# Patient Record
Sex: Female | Born: 1990
Health system: Southern US, Community
[De-identification: ages and names within clinical notes are randomized; demographics above are authoritative.]

## PROBLEM LIST (undated history)

## (undated) ENCOUNTER — Inpatient Hospital Stay (HOSPITAL_COMMUNITY): Payer: Self-pay

## (undated) DIAGNOSIS — E049 Nontoxic goiter, unspecified: Secondary | ICD-10-CM

## (undated) DIAGNOSIS — Z8619 Personal history of other infectious and parasitic diseases: Secondary | ICD-10-CM

## (undated) DIAGNOSIS — I8390 Asymptomatic varicose veins of unspecified lower extremity: Secondary | ICD-10-CM

## (undated) DIAGNOSIS — R51 Headache: Secondary | ICD-10-CM

## (undated) DIAGNOSIS — R519 Headache, unspecified: Secondary | ICD-10-CM

## (undated) DIAGNOSIS — R87629 Unspecified abnormal cytological findings in specimens from vagina: Secondary | ICD-10-CM

## (undated) HISTORY — DX: Unspecified abnormal cytological findings in specimens from vagina: R87.629

## (undated) HISTORY — PX: NO PAST SURGERIES: SHX2092

## (undated) SURGERY — Surgical Case
Anesthesia: *Unknown

---

## 2013-12-29 LAB — OB RESULTS CONSOLE ANTIBODY SCREEN: Antibody Screen: NEGATIVE

## 2013-12-29 LAB — OB RESULTS CONSOLE RPR: RPR: NONREACTIVE

## 2013-12-29 LAB — OB RESULTS CONSOLE ABO/RH: RH Type: POSITIVE

## 2013-12-29 LAB — OB RESULTS CONSOLE HEPATITIS B SURFACE ANTIGEN: Hepatitis B Surface Ag: NEGATIVE

## 2013-12-29 LAB — OB RESULTS CONSOLE RUBELLA ANTIBODY, IGM: Rubella: IMMUNE

## 2013-12-29 LAB — OB RESULTS CONSOLE HIV ANTIBODY (ROUTINE TESTING): HIV: NONREACTIVE

## 2014-01-03 LAB — OB RESULTS CONSOLE GC/CHLAMYDIA
Chlamydia: NEGATIVE
GC PROBE AMP, GENITAL: NEGATIVE

## 2014-05-23 ENCOUNTER — Inpatient Hospital Stay (HOSPITAL_COMMUNITY): Admission: AD | Admit: 2014-05-23 | Payer: Self-pay | Source: Ambulatory Visit | Admitting: Obstetrics and Gynecology

## 2014-07-06 ENCOUNTER — Telehealth (HOSPITAL_COMMUNITY): Payer: Self-pay | Admitting: *Deleted

## 2014-07-06 ENCOUNTER — Encounter (HOSPITAL_COMMUNITY): Payer: Self-pay | Admitting: *Deleted

## 2014-07-06 LAB — OB RESULTS CONSOLE GBS: STREP GROUP B AG: POSITIVE

## 2014-07-06 NOTE — Telephone Encounter (Signed)
Preadmission screen  

## 2014-07-15 ENCOUNTER — Inpatient Hospital Stay (HOSPITAL_COMMUNITY)
Admission: RE | Admit: 2014-07-15 | Discharge: 2014-07-19 | DRG: 766 | Disposition: A | Discharge status: Home / Self Care | Payer: 59 | Source: Ambulatory Visit | Attending: Obstetrics and Gynecology | Admitting: Obstetrics and Gynecology

## 2014-07-15 ENCOUNTER — Inpatient Hospital Stay (HOSPITAL_COMMUNITY): Payer: 59 | Admitting: Anesthesiology

## 2014-07-15 ENCOUNTER — Other Ambulatory Visit: Payer: Self-pay | Admitting: Obstetrics and Gynecology

## 2014-07-15 ENCOUNTER — Encounter (HOSPITAL_COMMUNITY): Payer: Self-pay

## 2014-07-15 VITALS — BP 122/69 | HR 74 | Temp 98.0°F | Resp 18 | Ht 62.0 in | Wt 161.0 lb

## 2014-07-15 DIAGNOSIS — Z3490 Encounter for supervision of normal pregnancy, unspecified, unspecified trimester: Secondary | ICD-10-CM

## 2014-07-15 DIAGNOSIS — N9089 Other specified noninflammatory disorders of vulva and perineum: Secondary | ICD-10-CM | POA: Diagnosis present

## 2014-07-15 DIAGNOSIS — R1110 Vomiting, unspecified: Secondary | ICD-10-CM | POA: Diagnosis present

## 2014-07-15 DIAGNOSIS — O99824 Streptococcus B carrier state complicating childbirth: Secondary | ICD-10-CM | POA: Diagnosis present

## 2014-07-15 DIAGNOSIS — N858 Other specified noninflammatory disorders of uterus: Secondary | ICD-10-CM | POA: Diagnosis present

## 2014-07-15 DIAGNOSIS — Z3493 Encounter for supervision of normal pregnancy, unspecified, third trimester: Secondary | ICD-10-CM

## 2014-07-15 DIAGNOSIS — O9989 Other specified diseases and conditions complicating pregnancy, childbirth and the puerperium: Secondary | ICD-10-CM | POA: Diagnosis present

## 2014-07-15 DIAGNOSIS — Z3A39 39 weeks gestation of pregnancy: Secondary | ICD-10-CM | POA: Diagnosis present

## 2014-07-15 DIAGNOSIS — O1203 Gestational edema, third trimester: Secondary | ICD-10-CM | POA: Diagnosis present

## 2014-07-15 LAB — RPR: RPR Ser Ql: NONREACTIVE

## 2014-07-15 LAB — CBC
HEMATOCRIT: 35.3 % — AB (ref 36.0–46.0)
Hemoglobin: 12.3 g/dL (ref 12.0–15.0)
MCH: 32.6 pg (ref 26.0–34.0)
MCHC: 34.8 g/dL (ref 30.0–36.0)
MCV: 93.6 fL (ref 78.0–100.0)
PLATELETS: 238 10*3/uL (ref 150–400)
RBC: 3.77 MIL/uL — ABNORMAL LOW (ref 3.87–5.11)
RDW: 14.3 % (ref 11.5–15.5)
WBC: 12.2 10*3/uL — AB (ref 4.0–10.5)

## 2014-07-15 LAB — ABO/RH: ABO/RH(D): B POS

## 2014-07-15 LAB — TYPE AND SCREEN
ABO/RH(D): B POS
Antibody Screen: NEGATIVE

## 2014-07-15 MED ORDER — LACTATED RINGERS IV SOLN: 500.0000 mL | INTRAVENOUS | Status: DC | PRN

## 2014-07-15 MED ORDER — CITRIC ACID-SODIUM CITRATE 334-500 MG/5ML PO SOLN
30.0000 mL | ORAL | Status: DC | PRN
  Administered 2014-07-16: 30 mL via ORAL
  Filled 2014-07-15: qty 15

## 2014-07-15 MED ORDER — OXYCODONE-ACETAMINOPHEN 5-325 MG PO TABS: 1.0000 | ORAL_TABLET | ORAL | Status: DC | PRN

## 2014-07-15 MED ORDER — FENTANYL 2.5 MCG/ML BUPIVACAINE 1/10 % EPIDURAL INFUSION (WH - ANES)
INTRAMUSCULAR | Status: DC | PRN
  Administered 2014-07-15: 14 mL/h via EPIDURAL

## 2014-07-15 MED ORDER — PHENYLEPHRINE 40 MCG/ML (10ML) SYRINGE FOR IV PUSH (FOR BLOOD PRESSURE SUPPORT): 80.0000 ug | PREFILLED_SYRINGE | INTRAVENOUS | Status: DC | PRN

## 2014-07-15 MED ORDER — LIDOCAINE HCL (PF) 1 % IJ SOLN
INTRAMUSCULAR | Status: DC | PRN
  Administered 2014-07-15 (×2): 4 mL

## 2014-07-15 MED ORDER — HYDROXYZINE HCL 50 MG PO TABS: 50.0000 mg | ORAL_TABLET | Freq: Four times a day (QID) | ORAL | Status: DC | PRN

## 2014-07-15 MED ORDER — BUTORPHANOL TARTRATE 1 MG/ML IJ SOLN
1.0000 mg | INTRAMUSCULAR | Status: DC | PRN
  Administered 2014-07-15 (×3): 1 mg via INTRAVENOUS
  Filled 2014-07-15 (×3): qty 1

## 2014-07-15 MED ORDER — LIDOCAINE HCL (PF) 1 % IJ SOLN: 30.0000 mL | INTRAMUSCULAR | Status: DC | PRN

## 2014-07-15 MED ORDER — TERBUTALINE SULFATE 1 MG/ML IJ SOLN: 0.2500 mg | Freq: Once | INTRAMUSCULAR | Status: AC | PRN

## 2014-07-15 MED ORDER — FENTANYL 2.5 MCG/ML BUPIVACAINE 1/10 % EPIDURAL INFUSION (WH - ANES)
14.0000 mL/h | INTRAMUSCULAR | Status: DC | PRN
  Administered 2014-07-16: 14 mL/h via EPIDURAL
  Filled 2014-07-15 (×2): qty 125

## 2014-07-15 MED ORDER — EPHEDRINE 5 MG/ML INJ: 10.0000 mg | INTRAVENOUS | Status: DC | PRN

## 2014-07-15 MED ORDER — LACTATED RINGERS IV SOLN
INTRAVENOUS | Status: DC
  Administered 2014-07-15 – 2014-07-16 (×5): via INTRAVENOUS

## 2014-07-15 MED ORDER — LACTATED RINGERS IV SOLN: 500.0000 mL | Freq: Once | INTRAVENOUS | Status: DC

## 2014-07-15 MED ORDER — DIPHENHYDRAMINE HCL 50 MG/ML IJ SOLN: 12.5000 mg | INTRAMUSCULAR | Status: DC | PRN

## 2014-07-15 MED ORDER — OXYTOCIN BOLUS FROM INFUSION: 500.0000 mL | INTRAVENOUS | Status: DC

## 2014-07-15 MED ORDER — PENICILLIN G POTASSIUM 5000000 UNITS IJ SOLR
5.0000 10*6.[IU] | Freq: Once | INTRAVENOUS | Status: AC
  Administered 2014-07-15: 5 10*6.[IU] via INTRAVENOUS
  Filled 2014-07-15: qty 5

## 2014-07-15 MED ORDER — OXYTOCIN 40 UNITS IN LACTATED RINGERS INFUSION - SIMPLE MED
1.0000 m[IU]/min | INTRAVENOUS | Status: DC
  Administered 2014-07-15: 1 m[IU]/min via INTRAVENOUS
  Filled 2014-07-15: qty 1000

## 2014-07-15 MED ORDER — DEXTROSE 5 % IV SOLN
2.5000 10*6.[IU] | INTRAVENOUS | Status: DC
  Administered 2014-07-15 – 2014-07-16 (×5): 2.5 10*6.[IU] via INTRAVENOUS
  Filled 2014-07-15 (×8): qty 2.5

## 2014-07-15 MED ORDER — OXYCODONE-ACETAMINOPHEN 5-325 MG PO TABS: 2.0000 | ORAL_TABLET | ORAL | Status: DC | PRN

## 2014-07-15 MED ORDER — ONDANSETRON HCL 4 MG/2ML IJ SOLN: 4.0000 mg | Freq: Four times a day (QID) | INTRAMUSCULAR | Status: DC | PRN

## 2014-07-15 MED ORDER — PHENYLEPHRINE 40 MCG/ML (10ML) SYRINGE FOR IV PUSH (FOR BLOOD PRESSURE SUPPORT)
80.0000 ug | PREFILLED_SYRINGE | INTRAVENOUS | Status: DC | PRN
  Filled 2014-07-15: qty 20

## 2014-07-15 MED ORDER — ACETAMINOPHEN 325 MG PO TABS
650.0000 mg | ORAL_TABLET | ORAL | Status: DC | PRN
  Administered 2014-07-16: 650 mg via ORAL
  Filled 2014-07-15: qty 2

## 2014-07-15 MED ORDER — OXYTOCIN 40 UNITS IN LACTATED RINGERS INFUSION - SIMPLE MED: 62.5000 mL/h | INTRAVENOUS | Status: DC

## 2014-07-15 NOTE — H&P (Signed)
Rachel Lynch is a 24 y.o. female presenting for at 4539 6/7 weeks dated by first trimester ultrasound presents for elective IOL. Pt desires delivery by EDD.   Pt transferred into [facility] at ~ 16 weeks.  Pregnancy with complications as below.  No significant issues after 20 weeks.  GBS positive. Pt reports she had strong regular contractions overnight but they have now spaced out with Pitocin.    Maternal Medical History:  Reason for admission: Induction of labor.  Contractions: Onset was 6-12 hours ago.   Frequency: regular.   Perceived severity is moderate.    Fetal activity: Perceived fetal activity is normal.    Prenatal complications: Hyperemesis Mild hyperthyroidism-resolved. Bleeding at 16 weeks, etiology unknown  Prenatal Complications - Diabetes: none.    OB History    Gravida Para Term Preterm AB TAB SAB Ectopic Multiple Living   5 2 2  2  1   2      Past Medical History  Diagnosis Date  . Vaginal Pap smear, abnormal    Past Surgical History  Procedure Laterality Date  . No past surgeries     Family History: family history includes Clotting disorder in her paternal grandmother; Diabetes in her maternal grandmother and maternal uncle; Hypertension in her paternal grandmother. Social History:  reports that she has never smoked. She does not have any smokeless tobacco history on file. She reports that she does not drink alcohol or use illicit drugs.   Prenatal Transfer Tool  Maternal Diabetes: No Genetic Screening: Normal Maternal Ultrasounds/Referrals: Normal Fetal Ultrasounds or other Referrals:  None Maternal Substance Abuse:  No Significant Maternal Medications:  None Significant Maternal Lab Results:  Lab values include: Group B Strep positive Other Comments:  None  Review of Systems  Gastrointestinal: Positive for abdominal pain.  Genitourinary:       No vaginal bleeding or LOF.    Dilation: 1.5 Effacement (%): 60 Station: -2 Exam by:: dr  Dion Bodyvarnado Blood pressure 109/81, pulse 107, temperature 98.2 F (36.8 C), temperature source Oral, resp. rate 18, height 5\' 2"  (1.575 m), weight 73.029 kg (161 lb), last menstrual period 09/15/2013. Maternal Exam:  Uterine Assessment: Contraction frequency is rare.   Abdomen: Patient reports no abdominal tenderness. Estimated fetal weight is 7 lbs.   Fetal presentation: vertex  Introitus: Vulva is positive for vulval edema. Normal vagina.  Ferning test: not done.   Pelvis: adequate for delivery.   Cervix: Cervix evaluated by digital exam.     Fetal Exam Fetal Monitor Review: Mode: fetoscope.   Pattern: accelerations present and no decelerations.    Fetal State Assessment: Category I - tracings are normal.    Membranes stripped.  Physical Exam  Constitutional: She appears well-developed and well-nourished.  HENT:  Head: Normocephalic and atraumatic.  Eyes: EOM are normal.  Neck: Normal range of motion.  Cardiovascular: Normal rate and regular rhythm.   Respiratory: Breath sounds normal. She is in respiratory distress.  GI:  Gravid, nontender.  Musculoskeletal: She exhibits edema. She exhibits no tenderness.  Neurological: She is alert.  Skin: Skin is warm and dry.  Psychiatric: She has a normal mood and affect.    Prenatal labs: ABO, Rh: --/--/B POS (04/09 0800) Antibody: NEG (04/09 0800) Rubella: Immune (09/24 0000) RPR: Nonreactive (09/24 0000)  HBsAg: Negative (09/24 0000)  HIV: Non-reactive (09/24 0000)  GBS: Positive (03/31 0000)   Assessment/Plan: IUP @ 39 6/7 weeks IOL with Pitocin increasing 2 by 2. Pt does not want an epidural.  IV Stadol ordered. GBS+. PCN for GBS prophylaxis. 2nd dose of PCN due ~ 1 pm. AROM with signs of labor and after 2nd dose administered.    Geryl Rankins 07/15/2014, 11:36 AM

## 2014-07-15 NOTE — Progress Notes (Signed)
Micael HampshireShanel S Solt is a 24 y.o. [id number] at 5770w6d   Subjective: Pt requesting pain medications.  S/p Stadol x 2.  Objective: BP 128/65 mmHg  Pulse 88  Temp(Src) 98.1 F (36.7 C) (Oral)  Resp 20  Ht 5\' 2"  (1.575 m)  Wt 73.029 kg (161 lb)  BMI 29.44 kg/m2  LMP 09/15/2013      FHT:  FHR: 130s bpm, variability: moderate,  accelerations:  Present,  decelerations:  Absent UC:   regular, every 3-4 minutes SVE:   Dilation: 2.5 Effacement (%): 50 Station: -2 Exam by:: Dr Dion BodyVarnado AROM 3 cm/70/-3. Cervix more posterior than previous Pitocin 20 mUs. IUPC placed due to protracted labor. Questionable light meconium.  Labs: Lab Results  Component Value Date   WBC 12.2* 07/15/2014   HGB 12.3 07/15/2014   HCT 35.3* 07/15/2014   MCV 93.6 07/15/2014   PLT 238 07/15/2014    Assessment / Plan: IUP @ 39 6/7 weeks. Elective IOL  Labor: Augmentation of labor with AROM. Preeclampsia:  BP normal. Fetal Wellbeing:  Category I Pain Control:  Stadol.  Epidural per pt request. I/D:  GBS positive.  S/p PCN x 3 doses. Anticipated MOD:  NSVD  Anjelica Gorniak 07/15/2014, 7:56 PM

## 2014-07-15 NOTE — Anesthesia Preprocedure Evaluation (Signed)
Anesthesia Evaluation  Patient identified by MRN, date of birth, ID band Patient awake    Reviewed: Allergy & Precautions, Patient's Chart, lab work & pertinent test results  Airway Mallampati: II  TM Distance: >3 FB Neck ROM: Full    Dental no notable dental hx. (+) Teeth Intact   Pulmonary neg pulmonary ROS,  breath sounds clear to auscultation  Pulmonary exam normal       Cardiovascular negative cardio ROS  Rhythm:Regular Rate:Normal     Neuro/Psych negative neurological ROS  negative psych ROS   GI/Hepatic negative GI ROS, Neg liver ROS,   Endo/Other  negative endocrine ROS  Renal/GU negative Renal ROS  negative genitourinary   Musculoskeletal negative musculoskeletal ROS (+)   Abdominal   Peds  Hematology negative hematology ROS (+)   Anesthesia Other Findings   Reproductive/Obstetrics (+) Pregnancy                             Anesthesia Physical Anesthesia Plan  ASA: II  Anesthesia Plan: Epidural   Post-op Pain Management:    Induction:   Airway Management Planned: Natural Airway  Additional Equipment:   Intra-op Plan:   Post-operative Plan:   Informed Consent: I have reviewed the patients History and Physical, chart, labs and discussed the procedure including the risks, benefits and alternatives for the proposed anesthesia with the patient or authorized representative who has indicated his/her understanding and acceptance.     Plan Discussed with: Anesthesiologist  Anesthesia Plan Comments:         Anesthesia Quick Evaluation

## 2014-07-15 NOTE — Anesthesia Procedure Notes (Signed)
Epidural Patient location during procedure: OB Start time: 07/15/2014 9:45 PM  Staffing Anesthesiologist: Mal AmabileFOSTER, Ravan Schlemmer Performed by: anesthesiologist   Preanesthetic Checklist Completed: patient identified, site marked, surgical consent, pre-op evaluation, timeout performed, IV checked, risks and benefits discussed and monitors and equipment checked  Epidural Patient position: sitting Prep: site prepped and draped and DuraPrep Patient monitoring: continuous pulse ox and blood pressure Approach: midline Location: L3-L4 Injection technique: LOR air  Needle:  Needle type: Tuohy  Needle gauge: 17 G Needle length: 9 cm and 9 Needle insertion depth: 4 cm Catheter type: closed end flexible Catheter size: 19 Gauge Catheter at skin depth: 9 cm Test dose: negative and Other  Assessment Events: blood not aspirated, injection not painful, no injection resistance, negative IV test and no paresthesia  Additional Notes Patient identified. Risks and benefits discussed including failed block, incomplete  Pain control, post dural puncture headache, nerve damage, paralysis, blood pressure Changes, nausea, vomiting, reactions to medications-both toxic and allergic and post Partum back pain. All questions were answered. Patient expressed understanding and wished to proceed. Sterile technique was used throughout procedure. Epidural site was Dressed with sterile barrier dressing. No paresthesias, signs of intravascular injection Or signs of intrathecal spread were encountered.  Patient was more comfortable after the epidural was dosed. Please see RN's note for documentation of vital signs and FHR which are stable.

## 2014-07-15 NOTE — Progress Notes (Signed)
In to assess pt.  Epidural just placed.  Return in 30 minutes to check cervix.  Pt still breathing through contractions.

## 2014-07-15 NOTE — Progress Notes (Signed)
In to assess pt.  RN checked pt ~ 4 pm. Was 2 cm. Pt breathing through contractions. Deferred repeat cervical exam. Cat 1 tracing. Contractions palpate firm. Pitocin-18 mUs. Pt encouraged to labor out of bed. Reassess at 6 pm.  Pt would like to defer AROM at this time.

## 2014-07-15 NOTE — Plan of Care (Signed)
Problem: Consults Goal: Birthing Suites Patient Information Press F2 to bring up selections list  Outcome: Completed/Met Date Met:  07/15/14  Pt 37-[redacted] weeks EGA and Inpatient induction     

## 2014-07-16 ENCOUNTER — Encounter (HOSPITAL_COMMUNITY)
Admission: RE | Disposition: A | Discharge status: Home / Self Care | Payer: Self-pay | Source: Ambulatory Visit | Attending: Obstetrics and Gynecology

## 2014-07-16 ENCOUNTER — Encounter (HOSPITAL_COMMUNITY): Payer: Self-pay

## 2014-07-16 SURGERY — Surgical Case
Anesthesia: Epidural

## 2014-07-16 MED ORDER — DIPHENHYDRAMINE HCL 50 MG/ML IJ SOLN
12.5000 mg | INTRAMUSCULAR | Status: DC | PRN
  Administered 2014-07-16 – 2014-07-17 (×3): 12.5 mg via INTRAVENOUS
  Filled 2014-07-16 (×3): qty 1

## 2014-07-16 MED ORDER — OXYTOCIN 40 UNITS IN LACTATED RINGERS INFUSION - SIMPLE MED: 1.0000 m[IU]/min | INTRAVENOUS | Status: DC

## 2014-07-16 MED ORDER — OXYCODONE-ACETAMINOPHEN 5-325 MG PO TABS
2.0000 | ORAL_TABLET | ORAL | Status: DC | PRN
  Administered 2014-07-17 – 2014-07-18 (×2): 2 via ORAL
  Filled 2014-07-16 (×2): qty 2

## 2014-07-16 MED ORDER — ONDANSETRON HCL 4 MG/2ML IJ SOLN: 4.0000 mg | Freq: Three times a day (TID) | INTRAMUSCULAR | Status: DC | PRN

## 2014-07-16 MED ORDER — DIPHENHYDRAMINE HCL 25 MG PO CAPS
25.0000 mg | ORAL_CAPSULE | Freq: Four times a day (QID) | ORAL | Status: DC | PRN
  Administered 2014-07-17: 25 mg via ORAL
  Filled 2014-07-16: qty 1

## 2014-07-16 MED ORDER — SODIUM BICARBONATE 8.4 % IV SOLN
INTRAVENOUS | Status: DC | PRN
  Administered 2014-07-16: 6 mL via EPIDURAL
  Administered 2014-07-16: 4 mL via EPIDURAL
  Administered 2014-07-16: 5 mL via EPIDURAL

## 2014-07-16 MED ORDER — METHYLERGONOVINE MALEATE 0.2 MG PO TABS: 0.2000 mg | ORAL_TABLET | ORAL | Status: DC | PRN

## 2014-07-16 MED ORDER — SODIUM CHLORIDE 0.9 % IJ SOLN: 3.0000 mL | INTRAMUSCULAR | Status: DC | PRN

## 2014-07-16 MED ORDER — NALOXONE HCL 1 MG/ML IJ SOLN
1.0000 ug/kg/h | INTRAVENOUS | Status: DC | PRN
  Filled 2014-07-16: qty 2

## 2014-07-16 MED ORDER — NALBUPHINE HCL 10 MG/ML IJ SOLN: 5.0000 mg | INTRAMUSCULAR | Status: DC | PRN

## 2014-07-16 MED ORDER — OXYCODONE-ACETAMINOPHEN 5-325 MG PO TABS
1.0000 | ORAL_TABLET | ORAL | Status: DC | PRN
  Administered 2014-07-16 – 2014-07-19 (×9): 1 via ORAL
  Filled 2014-07-16 (×10): qty 1

## 2014-07-16 MED ORDER — ACETAMINOPHEN 500 MG PO TABS
1000.0000 mg | ORAL_TABLET | Freq: Four times a day (QID) | ORAL | Status: AC
  Administered 2014-07-16: 1000 mg via ORAL
  Filled 2014-07-16 (×2): qty 2

## 2014-07-16 MED ORDER — FENTANYL CITRATE 0.05 MG/ML IJ SOLN
INTRAMUSCULAR | Status: AC
  Administered 2014-07-16: 50 ug via INTRAVENOUS
  Filled 2014-07-16: qty 2

## 2014-07-16 MED ORDER — DIBUCAINE 1 % RE OINT: 1.0000 "application " | TOPICAL_OINTMENT | RECTAL | Status: DC | PRN

## 2014-07-16 MED ORDER — AMPICILLIN-SULBACTAM SODIUM 3 (2-1) G IJ SOLR
3.0000 g | INTRAMUSCULAR | Status: AC
  Administered 2014-07-16: 3 g via INTRAVENOUS
  Filled 2014-07-16: qty 3

## 2014-07-16 MED ORDER — NALBUPHINE HCL 10 MG/ML IJ SOLN
5.0000 mg | Freq: Once | INTRAMUSCULAR | Status: AC | PRN
  Administered 2014-07-16: 5 mg via INTRAVENOUS
  Filled 2014-07-16: qty 1

## 2014-07-16 MED ORDER — 0.9 % SODIUM CHLORIDE (POUR BTL) OPTIME
TOPICAL | Status: DC | PRN
  Administered 2014-07-16: 250 mL
  Administered 2014-07-16: 400 mL

## 2014-07-16 MED ORDER — KETOROLAC TROMETHAMINE 30 MG/ML IJ SOLN
30.0000 mg | Freq: Four times a day (QID) | INTRAMUSCULAR | Status: AC | PRN
  Administered 2014-07-16: 30 mg via INTRAMUSCULAR

## 2014-07-16 MED ORDER — NALOXONE HCL 0.4 MG/ML IJ SOLN
0.4000 mg | INTRAMUSCULAR | Status: DC | PRN
Start: 2014-07-16 — End: 2014-07-19

## 2014-07-16 MED ORDER — MEPERIDINE HCL 25 MG/ML IJ SOLN: 6.2500 mg | INTRAMUSCULAR | Status: DC | PRN

## 2014-07-16 MED ORDER — OXYTOCIN 40 UNITS IN LACTATED RINGERS INFUSION - SIMPLE MED: 62.5000 mL/h | INTRAVENOUS | Status: AC

## 2014-07-16 MED ORDER — PROMETHAZINE HCL 25 MG/ML IJ SOLN: 6.2500 mg | INTRAMUSCULAR | Status: DC | PRN

## 2014-07-16 MED ORDER — MIDAZOLAM HCL 2 MG/2ML IJ SOLN
INTRAMUSCULAR | Status: AC
  Filled 2014-07-16: qty 2

## 2014-07-16 MED ORDER — NALBUPHINE HCL 10 MG/ML IJ SOLN
5.0000 mg | INTRAMUSCULAR | Status: DC | PRN
  Administered 2014-07-16 – 2014-07-17 (×3): 5 mg via INTRAVENOUS
  Filled 2014-07-16 (×3): qty 1

## 2014-07-16 MED ORDER — SENNOSIDES-DOCUSATE SODIUM 8.6-50 MG PO TABS
2.0000 | ORAL_TABLET | ORAL | Status: DC
  Administered 2014-07-16 – 2014-07-18 (×3): 2 via ORAL
  Filled 2014-07-16 (×3): qty 2

## 2014-07-16 MED ORDER — KETOROLAC TROMETHAMINE 30 MG/ML IJ SOLN: 30.0000 mg | Freq: Four times a day (QID) | INTRAMUSCULAR | Status: AC | PRN

## 2014-07-16 MED ORDER — OXYTOCIN 10 UNIT/ML IJ SOLN
40.0000 [IU] | INTRAMUSCULAR | Status: DC | PRN
  Administered 2014-07-16: 40 [IU] via INTRAVENOUS

## 2014-07-16 MED ORDER — OXYTOCIN 10 UNIT/ML IJ SOLN
INTRAMUSCULAR | Status: AC
  Filled 2014-07-16: qty 4

## 2014-07-16 MED ORDER — FENTANYL CITRATE 0.05 MG/ML IJ SOLN
INTRAMUSCULAR | Status: AC
Start: 2014-07-16 — End: 2014-07-16
  Filled 2014-07-16: qty 2

## 2014-07-16 MED ORDER — SCOPOLAMINE 1 MG/3DAYS TD PT72
MEDICATED_PATCH | TRANSDERMAL | Status: AC
  Filled 2014-07-16: qty 1

## 2014-07-16 MED ORDER — ACETAMINOPHEN 10 MG/ML IV SOLN
1000.0000 mg | Freq: Once | INTRAVENOUS | Status: AC
  Administered 2014-07-16: 1000 mg via INTRAVENOUS
  Filled 2014-07-16: qty 100

## 2014-07-16 MED ORDER — PRENATAL MULTIVITAMIN CH
1.0000 | ORAL_TABLET | Freq: Every day | ORAL | Status: DC
  Administered 2014-07-17 – 2014-07-19 (×3): 1 via ORAL
  Filled 2014-07-16 (×2): qty 1

## 2014-07-16 MED ORDER — ZOLPIDEM TARTRATE 5 MG PO TABS: 5.0000 mg | ORAL_TABLET | Freq: Every evening | ORAL | Status: DC | PRN

## 2014-07-16 MED ORDER — FENTANYL CITRATE 0.05 MG/ML IJ SOLN
INTRAMUSCULAR | Status: AC
  Filled 2014-07-16: qty 2

## 2014-07-16 MED ORDER — FENTANYL CITRATE 0.05 MG/ML IJ SOLN
25.0000 ug | INTRAMUSCULAR | Status: DC | PRN
  Administered 2014-07-16 (×2): 50 ug via INTRAVENOUS

## 2014-07-16 MED ORDER — LACTATED RINGERS IV SOLN: 40.0000 [IU] | INTRAVENOUS | Status: DC | PRN

## 2014-07-16 MED ORDER — NALBUPHINE HCL 10 MG/ML IJ SOLN: 5.0000 mg | Freq: Once | INTRAMUSCULAR | Status: AC | PRN

## 2014-07-16 MED ORDER — SIMETHICONE 80 MG PO CHEW
80.0000 mg | CHEWABLE_TABLET | ORAL | Status: DC
  Administered 2014-07-16 – 2014-07-18 (×3): 80 mg via ORAL
  Filled 2014-07-16 (×3): qty 1

## 2014-07-16 MED ORDER — WITCH HAZEL-GLYCERIN EX PADS: 1.0000 "application " | MEDICATED_PAD | CUTANEOUS | Status: DC | PRN

## 2014-07-16 MED ORDER — MIDAZOLAM HCL 2 MG/2ML IJ SOLN
INTRAMUSCULAR | Status: DC | PRN
  Administered 2014-07-16: 1 mg via INTRAVENOUS

## 2014-07-16 MED ORDER — ONDANSETRON HCL 4 MG/2ML IJ SOLN
INTRAMUSCULAR | Status: AC
  Filled 2014-07-16: qty 2

## 2014-07-16 MED ORDER — ACETAMINOPHEN 325 MG PO TABS: 650.0000 mg | ORAL_TABLET | ORAL | Status: DC | PRN

## 2014-07-16 MED ORDER — METHYLERGONOVINE MALEATE 0.2 MG/ML IJ SOLN: 0.2000 mg | INTRAMUSCULAR | Status: DC | PRN

## 2014-07-16 MED ORDER — IBUPROFEN 600 MG PO TABS
600.0000 mg | ORAL_TABLET | Freq: Four times a day (QID) | ORAL | Status: DC
  Administered 2014-07-16 – 2014-07-19 (×12): 600 mg via ORAL
  Filled 2014-07-16 (×11): qty 1

## 2014-07-16 MED ORDER — SCOPOLAMINE 1 MG/3DAYS TD PT72
1.0000 | MEDICATED_PATCH | Freq: Once | TRANSDERMAL | Status: AC
  Administered 2014-07-16: 1.5 mg via TRANSDERMAL

## 2014-07-16 MED ORDER — MENTHOL 3 MG MT LOZG: 1.0000 | LOZENGE | OROMUCOSAL | Status: DC | PRN

## 2014-07-16 MED ORDER — LANOLIN HYDROUS EX OINT: 1.0000 "application " | TOPICAL_OINTMENT | CUTANEOUS | Status: DC | PRN

## 2014-07-16 MED ORDER — DIPHENHYDRAMINE HCL 25 MG PO CAPS: 25.0000 mg | ORAL_CAPSULE | ORAL | Status: DC | PRN

## 2014-07-16 MED ORDER — KETOROLAC TROMETHAMINE 30 MG/ML IJ SOLN
INTRAMUSCULAR | Status: AC
  Filled 2014-07-16: qty 1

## 2014-07-16 MED ORDER — SIMETHICONE 80 MG PO CHEW: 80.0000 mg | CHEWABLE_TABLET | ORAL | Status: DC | PRN

## 2014-07-16 MED ORDER — SIMETHICONE 80 MG PO CHEW
80.0000 mg | CHEWABLE_TABLET | Freq: Three times a day (TID) | ORAL | Status: DC
  Administered 2014-07-16 – 2014-07-19 (×8): 80 mg via ORAL
  Filled 2014-07-16 (×7): qty 1

## 2014-07-16 MED ORDER — ONDANSETRON HCL 4 MG/2ML IJ SOLN
INTRAMUSCULAR | Status: DC | PRN
  Administered 2014-07-16: 4 mg via INTRAVENOUS

## 2014-07-16 MED ORDER — TETANUS-DIPHTH-ACELL PERTUSSIS 5-2.5-18.5 LF-MCG/0.5 IM SUSP: 0.5000 mL | Freq: Once | INTRAMUSCULAR | Status: DC

## 2014-07-16 MED ORDER — MORPHINE SULFATE 0.5 MG/ML IJ SOLN
INTRAMUSCULAR | Status: AC
  Filled 2014-07-16: qty 10

## 2014-07-16 MED ORDER — MORPHINE SULFATE (PF) 0.5 MG/ML IJ SOLN
INTRAMUSCULAR | Status: DC | PRN
  Administered 2014-07-16: 4 mg via EPIDURAL
  Administered 2014-07-16: 1 mg via INTRAVENOUS

## 2014-07-16 MED ORDER — FENTANYL CITRATE 0.05 MG/ML IJ SOLN
INTRAMUSCULAR | Status: DC | PRN
  Administered 2014-07-16 (×6): 50 ug via INTRAVENOUS

## 2014-07-16 MED ORDER — LACTATED RINGERS IV SOLN
INTRAVENOUS | Status: DC
  Administered 2014-07-16 (×2): via INTRAVENOUS
  Administered 2014-07-17: 1000 mL via INTRAVENOUS

## 2014-07-16 SURGICAL SUPPLY — 41 items
BARRIER ADHS 3X4 INTERCEED (GAUZE/BANDAGES/DRESSINGS) ×6 IMPLANT
BENZOIN TINCTURE PRP APPL 2/3 (GAUZE/BANDAGES/DRESSINGS) ×3 IMPLANT
CLAMP CORD UMBIL (MISCELLANEOUS) IMPLANT
CLOSURE WOUND 1/2 X4 (GAUZE/BANDAGES/DRESSINGS) ×1
CLOTH BEACON ORANGE TIMEOUT ST (SAFETY) ×3 IMPLANT
DRAPE SHEET LG 3/4 BI-LAMINATE (DRAPES) IMPLANT
DRSG OPSITE POSTOP 4X10 (GAUZE/BANDAGES/DRESSINGS) ×3 IMPLANT
DURAPREP 26ML APPLICATOR (WOUND CARE) ×3 IMPLANT
ELECT REM PT RETURN 9FT ADLT (ELECTROSURGICAL) ×3
ELECTRODE REM PT RTRN 9FT ADLT (ELECTROSURGICAL) ×1 IMPLANT
EXTRACTOR VACUUM BELL STYLE (SUCTIONS) IMPLANT
GAUZE SPONGE 4X4 12PLY STRL (GAUZE/BANDAGES/DRESSINGS) ×3 IMPLANT
GLOVE BIO SURGEON STRL SZ7 (GLOVE) ×3 IMPLANT
GLOVE BIOGEL PI IND STRL 7.0 (GLOVE) ×1 IMPLANT
GLOVE BIOGEL PI INDICATOR 7.0 (GLOVE) ×2
GOWN STRL REUS W/TWL LRG LVL3 (GOWN DISPOSABLE) ×6 IMPLANT
KIT ABG SYR 3ML LUER SLIP (SYRINGE) IMPLANT
LIQUID BAND (GAUZE/BANDAGES/DRESSINGS) IMPLANT
NEEDLE HYPO 25X5/8 SAFETYGLIDE (NEEDLE) IMPLANT
NS IRRIG 1000ML POUR BTL (IV SOLUTION) ×3 IMPLANT
PACK C SECTION WH (CUSTOM PROCEDURE TRAY) ×3 IMPLANT
PAD ABD 7.5X8 STRL (GAUZE/BANDAGES/DRESSINGS) ×3 IMPLANT
PAD OB MATERNITY 4.3X12.25 (PERSONAL CARE ITEMS) ×3 IMPLANT
RETRACTOR WND ALEXIS 25 LRG (MISCELLANEOUS) ×1 IMPLANT
RTRCTR C-SECT PINK 25CM LRG (MISCELLANEOUS) ×3 IMPLANT
RTRCTR WOUND ALEXIS 25CM LRG (MISCELLANEOUS) ×3
STRIP CLOSURE SKIN 1/2X4 (GAUZE/BANDAGES/DRESSINGS) ×2 IMPLANT
SUT CHROMIC 0 CTX 36 (SUTURE) IMPLANT
SUT PLAIN 2 0 (SUTURE)
SUT PLAIN 2 0 XLH (SUTURE) ×3 IMPLANT
SUT PLAIN ABS 2-0 54XMFL TIE (SUTURE) IMPLANT
SUT VIC AB 0 CT1 27 (SUTURE) ×4
SUT VIC AB 0 CT1 27XBRD ANBCTR (SUTURE) ×2 IMPLANT
SUT VIC AB 0 CTX 36 (SUTURE) ×6
SUT VIC AB 0 CTX36XBRD ANBCTRL (SUTURE) ×3 IMPLANT
SUT VIC AB 2-0 CT1 27 (SUTURE) ×2
SUT VIC AB 2-0 CT1 TAPERPNT 27 (SUTURE) ×1 IMPLANT
SUT VIC AB 4-0 KS 27 (SUTURE) ×3 IMPLANT
TAPE CLOTH SURG 4X10 WHT LF (GAUZE/BANDAGES/DRESSINGS) ×3 IMPLANT
TOWEL OR 17X24 6PK STRL BLUE (TOWEL DISPOSABLE) ×3 IMPLANT
TRAY FOLEY CATH SILVER 14FR (SET/KITS/TRAYS/PACK) IMPLANT

## 2014-07-16 NOTE — Transfer of Care (Signed)
Immediate Anesthesia Transfer of Care Note  Patient: Rachel Lynch  Procedure(s) Performed: Procedure(s): CESAREAN SECTION (N/A)  Patient Location: PACU  Anesthesia Type:Epidural  Level of Consciousness: awake and alert   Airway & Oxygen Therapy: Patient Spontanous Breathing  Post-op Assessment: Report given to RN and Post -op Vital signs reviewed and stable  Post vital signs: Reviewed and stable  Last Vitals:  Filed Vitals:   07/16/14 0848  BP: 117/76  Pulse: 152  Temp:   Resp: 20    Complications: No apparent anesthesia complications

## 2014-07-16 NOTE — Anesthesia Postprocedure Evaluation (Signed)
  Anesthesia Post-op Note  Patient: Rachel Lynch  Procedure(s) Performed: Procedure(s): CESAREAN SECTION (N/A)  Patient Location: Mother/Baby  Anesthesia Type:Epidural  Level of Consciousness: awake  Airway and Oxygen Therapy: Patient Spontanous Breathing  Post-op Pain: mild  Post-op Assessment: Patient's Cardiovascular Status Stable and Respiratory Function Stable  Post-op Vital Signs: stable  Last Vitals:  Filed Vitals:   07/16/14 1445  BP: 113/67  Pulse: 63  Temp: 36.7 C  Resp: 18    Complications: No apparent anesthesia complications

## 2014-07-16 NOTE — Lactation Note (Signed)
This note was copied from the chart of Rachel Lynch. Lactation Consultation Note  Patient Name: Rachel Vito BergerShanel Ismael ZOXWR'UToday's Date: 07/16/2014 Reason for consult: Initial assessment of this mom and baby at 7 hours pp.  This is mom's third child and she breastfed the oldest child for 8 months and younger child for 12 months.  Older siblings are visiting and daughter is holding baby.  Mom states her newborn is latching well and LC encouraged cue feedings, STS and mom to call for help or questions as needed. Mom encouraged to feed baby 8-12 times/24 hours and with feeding cues. LC encouraged review of Baby and Me pp 9, 14 and 20-25 for STS and BF information. LC provided [employer] Resource brochure and reviewed [facility] services and list of community and web site resources.    Maternal Data Formula Feeding for Exclusion: No Has patient been taught Hand Expression?:  (experienced breastfeeding mom) Does the patient have breastfeeding experience prior to this delivery?: Yes  Feeding Feeding Type: Breast Fed  LATCH Score/Interventions           initial LATCH score=7 per RN assessment           Lactation Tools Discussed/Used   STS, cue feedings, BF resources  Consult Status Consult Status: Follow-up Date: 07/17/14 Follow-up type: In-patient    Warrick ParisianBryant, Laguana Desautel [facility] 07/16/2014, 5:23 PM

## 2014-07-16 NOTE — Progress Notes (Addendum)
Rachel Lynch is a 24 y.o. [id number] at 482w0d   Subjective: Pt with c/o right side/lower back pain.  Pt on right side with RN at bedside.  Objective: BP 133/90 mmHg  Pulse 106  Temp(Src) 99.4 F (37.4 C) (Oral)  Resp 20  Ht 5\' 2"  (1.575 m)  Wt 73.029 kg (161 lb)  BMI 29.44 kg/m2  SpO2 100%  LMP 09/15/2013       FHT:  FHR: 120s bpm, variability: moderate,  accelerations:  Present,  decelerations:  Present Variable decels UC:   Couplets every 3-4 minutes SVE:   Dilation: 8.5 Effacement (%): 90 Station: 0 Exam by:: dr Dion Bodyvarnado Right side of cervix edematous, left side completely effaced. Pitocin 36 mUs. MVUs 200-210 at 0315.  165-180 at 0300. Labs: Lab Results  Component Value Date   WBC 12.2* 07/15/2014   HGB 12.3 07/15/2014   HCT 35.3* 07/15/2014   MCV 93.6 07/15/2014   PLT 238 07/15/2014    Assessment / Plan: Protracted active phase  Suspect malpresentation clinically.  Contractions just became adequate.   Labor: Protracted labor.  Hold Pitocin x 1 hour, resume at 10 mUs.  Peanut used for rotation. Preeclampsia:  no signs or symptoms of toxicity Fetal Wellbeing:  Category II Pain Control:  Epidural I/D:  GBS+ on PCN. Anticipated MOD:  NSVD  [name] 07/16/2014, 4:21 AM

## 2014-07-16 NOTE — Op Note (Signed)
[name]:  Sees, Dezi              ACCOUNT NO.:  [id number]  MEDICAL RECORD NO.:  [id number]  LOCATION:  [facility]                          [facility]ACILITY:  [facility]  PHYSICIAN:  Pieter PartridgeEvelyn B Kimorah Ridolfi, MD   DATE OF BIRTH:  [birth year]  DATE OF PROCEDURE:  07/16/2014 DATE OF DISCHARGE:                              OPERATIVE REPORT   PREOPERATIVE DIAGNOSIS:  Intrauterine pregnancy at 40 0/7 weeks, failure to progress, transverse presentation, and GBS positive.  POSTOPERATIVE DIAGNOSIS:  Intrauterine pregnancy at 40 0/7 weeks, failure to progress, transverse presentation, and GBS positive.  PROCEDURE:  Primary low transverse cesarean section with 2-layer closure.  SURGEON:  Pieter PartridgeEvelyn B Makhi Muzquiz, MD.  ASSISTANT:  Damien FusiJenna Sander, certified nurse midwife.  ANESTHESIA:  Epidural.  ESTIMATED BLOOD LOSS:  600.  BLOOD ADMINISTERED:  None.  DRAINS:  Foley catheter.  LOCAL:  None.  SPECIMENS:  Placenta to Pathology.  DISPOSITION:  The patient disposition to PACU hemodynamically stable.  COMPLICATIONS:  None.  FINDINGS:  Viable female in the vertex position, left transverse LOT (left occiput transverse).  Apgars 7 and 9.  Normal uterus.  Normal endometrial cavity.  No adnexal masses.  INDICATIONS:  [name] was admitted for an elective induction of labor per patient request at 39-6/7th weeks.  She was started on Pitocin.  Latent labor was prolonged.  She was ruptured at approximately 8 o'clock and was 5 cm.  Induction and cervical dilatation started at 1 cm.  The patient was increased to a maximum of 36 of Pitocin and contractions were adequate at that time.  There was some edema on the right side of the cervix and one portion was completely effaced on the left side.  The patient was rotated.  Pitocin was held for 1 hour and Resumed. At the time of resuming was 160, and as the Pitocin increased, MVUs went down.  The patient was examined approximately 12 hours after rupture and baby was felt to be  transverse. She was counseled on primary C section.  She was also GBS positive and received penicillin at that time when Pitocin was started.  DESCRIPTION OF PROCEDURE:  [name] was identified in the operating room.  She was prepped in a sterile fashion.  Time-out was taken.  She was then draped.  Abdomen was marked with a Pfannenstiel skin incision 2 cm above the symphysis pubis.  Incision was made with a scalpel.  Subcutaneous base was opened with the Bovie.  Rectus muscles were dissected sharply off the fascia with the Metzenbaum scissors.  Of note, the patient had a significant diastasis. Peritoneum was entered sharply with Metzenbaum scissors and stretched. Intraabdominal palpation was performed.  No adhesions were noted. Alexis retractor was then placed.  Lower uterine segment was identified and a bladder flap was developed using the Metzenbaum scissors.  A transverse incision was then made on the lower uterine segment of the uterus with a scalpel and extended with the bandage scissors.  Head was delivered easily to the incision.  Upon opening the uterus, the baby's right ear was seen.  Nose and mouth were suctioned prior to delivery of the head.  The shoulders were delivered somewhat delayed.  The baby was  rotated and delivered atraumatically. Nose and mouth were suctioned again, and cord was clamped x2 and cut. Baby was peeking up on the field.  Eyes were opening and she had good tone.  She was placed at the bedside to the awaiting NICU staff.  Cord blood was then obtained.  Placenta was then delivered manually after ring forceps were used to grasp the incision.  Uterus was cleared of all clots and debris with moist laparotomy sponge.  Two layer closure was performed with 0 Vicryl in a continuous locked fashion.  Second layer was for imbrication.  Hemostasis with 0 Vicryl was needed.  On the right side, there was an extension.  Special care was taken not to get into the  uterine artery.  That was palpated.  Irrigation was performed in the intraabdominal cavity.  A sponge was placed on the inside, moist laparotomy sponge with a tag that was removed prior to irrigation.  Interceed was placed at the lower uterine segment as an adhesive barrier.  At this time, I desired to close the peritoneum.  The patient was uncomfortable with complaining of pain at her sternum.  However, we tested and she did not have any pain in the lower uterine segment. Due to her diastasis, I reapproximated the peritoneum and muscles at the midline with 2-0 Vicryl.  Fascia was then reapproximated with 0 Vicryl in a continuous running fashion.  Irrigation was performed layer by layer.  Hemostasis was achieved in the subcutaneous space.  The skin was then reapproximated with 4-0 Vicryl on a Keith needle.  INSTRUMENTS:  Sponge and needle counts were correct x3.  The patient's temperature was 100.1.  Prior to coming to the OR, she was given Unasyn prior to the start of the surgery.  Baby remained in the OR skin-to-skin with father.     Pieter Partridge, MD     EBV/MEDQ  D:  07/16/2014  T:  07/16/2014  Job:  [id number]

## 2014-07-16 NOTE — Brief Op Note (Signed)
07/15/2014 - 07/16/2014  10:32 AM  PATIENT:  Rachel Lynch  24 y.o. female  PRE-OPERATIVE DIAGNOSIS:  IUP at 40 0/7 weeks,  FAILURE TO PROGRESS, Transverse presentation, GBS+  POST-OPERATIVE DIAGNOSIS:  Same  PROCEDURE:  Procedure(s): CESAREAN SECTION (N/A), Primary, LTCS  SURGEON:  Surgeon(s) and Role:    * Geryl RankinsEvelyn Romie Keeble, MD - Primary  PHYSICIAN ASSISTANT: None  ASSISTANTS: Venus Standard, CNM   ANESTHESIA:   epidural  EBL:  Total I/O In: 2500 [I.V.:2500] Out: 1500 [Urine:900; Blood:600]  BLOOD ADMINISTERED:None  DRAINS: Urinary Catheter (Foley)   LOCAL MEDICATIONS USED:  NONE  SPECIMEN:  Source of Specimen:  placenta  DISPOSITION OF SPECIMEN:  PATHOLOGY  COUNTS:  YES  TOURNIQUET:  * No tourniquets in log *  DICTATION: .Other Dictation: Dictation Number [id number]  PLAN OF CARE: Transfer to Postpartum after PACU  PATIENT DISPOSITION:  PACU - hemodynamically stable.   Delay start of Pharmacological VTE agent (>24hrs) due to surgical blood loss or risk of bleeding: yes

## 2014-07-16 NOTE — Anesthesia Postprocedure Evaluation (Signed)
  Anesthesia Post-op Note  Patient: Rachel Lynch  Procedure(s) Performed: Procedure(s): CESAREAN SECTION (N/A)  Patient Location: PACU  Anesthesia Type:Regional  Level of Consciousness: awake, alert  and oriented  Airway and Oxygen Therapy: Patient Spontanous Breathing and Patient connected to nasal cannula oxygen  Post-op Pain: moderate  Post-op Assessment: Post-op Vital signs reviewed, Patient's Cardiovascular Status Stable, Respiratory Function Stable, Patent Airway and No signs of Nausea or vomiting  Post-op Vital Signs: Reviewed and stable  Last Vitals:  Filed Vitals:   07/16/14 0848  BP: 117/76  Pulse: 152  Temp:   Resp: 20    Complications: No apparent anesthesia complications

## 2014-07-16 NOTE — Progress Notes (Signed)
Rachel Lynch is a 24 y.o. [id number] at 4869w0d   Subjective: Pt is comfortable.  No longer feels pressure.  Objective: BP 116/76 mmHg  Pulse 133  Temp(Src) 100.1 F (37.8 C) (Oral)  Resp 20  Ht 5\' 2"  (1.575 m)  Wt 73.029 kg (161 lb)  BMI 29.44 kg/m2  SpO2 100%  LMP 09/15/2013      FHT:  FHR: 140s bpm, variability: moderate,  accelerations:  Present,  decelerations:  Present Variable UC:   irregular, every 3-4 minutes SVE:   Dilation: 7.5 Effacement (%): 80 Station: -1 Exam by:: Inaaya Vellucci Edematous cervix all around.  Fetal station has decreased to -1,-2 LOT presentation suspected.  Mild caput. MVUs 160s previously, now 100. Labs: Lab Results  Component Value Date   WBC 12.2* 07/15/2014   HGB 12.3 07/15/2014   HCT 35.3* 07/15/2014   MCV 93.6 07/15/2014   PLT 238 07/15/2014    Assessment / Plan: Arrest in active phase of labor  Labor: MVUs decreasing as Pitocin increasing. Preeclampsia:  No s/sxs of Preeclampsia Fetal Wellbeing:  Category II Overall reassuring, reactive tracing. Pain Control:  Epidural I/D:  Temperature increasing.  Unasyn in OR. Anticipated MOD:  Proceed with primary cesarean section.  Geryl RankinsVARNADO, Syre Knerr 07/16/2014, 8:35 AM

## 2014-07-17 ENCOUNTER — Encounter (HOSPITAL_COMMUNITY): Payer: Self-pay | Admitting: Obstetrics and Gynecology

## 2014-07-17 LAB — CBC
HEMATOCRIT: 30.6 % — AB (ref 36.0–46.0)
HEMOGLOBIN: 10.7 g/dL — AB (ref 12.0–15.0)
MCH: 33 pg (ref 26.0–34.0)
MCHC: 35 g/dL (ref 30.0–36.0)
MCV: 94.4 fL (ref 78.0–100.0)
Platelets: 199 10*3/uL (ref 150–400)
RBC: 3.24 MIL/uL — ABNORMAL LOW (ref 3.87–5.11)
RDW: 14.8 % (ref 11.5–15.5)
WBC: 17.7 10*3/uL — ABNORMAL HIGH (ref 4.0–10.5)

## 2014-07-17 NOTE — Progress Notes (Signed)
Subjective: Postop Day 1: Cesarean Delivery No complaints.  Pain controlled.  Lochia normal.  Breast feeding yes.  Has  Not ambulated in halls, lots of gas.    Objective: Temp:  [97.7 F (36.5 C)-98.7 F (37.1 C)] 97.7 F (36.5 C) (04/11 0945) Pulse Rate:  [62-101] 73 (04/11 0945) Resp:  [18-20] 18 (04/11 0945) BP: (108-137)/(75-95) 108/81 mmHg (04/11 0945) SpO2:  [95 %-97 %] 96 % (04/11 0945)  Physical Exam: Gen: NAD Lochia: Not visualized Uterine Fundus: firm, appropriately tender Abdomen:  Distended, tympanic to percussion.  Appearance c/w antepartum. Incision: clean, dry and intact, healing well DVT Evaluation: +2 Edema present, no calf tenderness bilaterally    Recent Labs  07/15/14 0800 07/17/14 0730  HGB 12.3 10.7*  HCT 35.3* 30.6*    Assessment/Plan: Status post C-section-doing well postoperatively. Continue routine post op care. Pt requests discharge tomorrow.  Encouraged ambulation in halls today to prepare for discharge. Lactation support. Afebrile.  No signs of endometritis.    Geryl RankinsVARNADO, Arieonna Medine 07/17/2014, 2:46 PM

## 2014-07-18 MED ORDER — OXYCODONE-ACETAMINOPHEN 5-325 MG PO TABS: ORAL_TABLET | ORAL | Status: DC

## 2014-07-18 MED ORDER — IBUPROFEN 600 MG PO TABS: 600.0000 mg | ORAL_TABLET | Freq: Four times a day (QID) | ORAL | Status: DC

## 2014-07-18 MED ORDER — BISACODYL 10 MG RE SUPP
10.0000 mg | Freq: Every day | RECTAL | Status: DC | PRN
  Administered 2014-07-18: 10 mg via RECTAL
  Filled 2014-07-18: qty 1

## 2014-07-18 NOTE — Discharge Summary (Addendum)
Obstetric Discharge Summary Reason for Admission: induction of labor Prenatal Procedures: none Intrapartum Procedures: cesarean: low cervical, transverse and Failure to progress, Transverse presentation Postpartum Procedures: none Complications-Operative and Postpartum: none HEMOGLOBIN  Date Value Ref Range Status  07/17/2014 10.7* 12.0 - 15.0 g/dL Final   HCT  Date Value Ref Range Status  07/17/2014 30.6* 36.0 - 46.0 % Final    Physical Exam:  General: alert, cooperative and no distress Lochia: appropriate Uterine Fundus: firm Incision: Dressing clean DVT Evaluation: No evidence of DVT seen on physical exam. Calf/Ankle edema is present.  Discharge Diagnoses: Term Pregnancy-delivered  Discharge Information: Date: 07/18/2014 Activity: pelvic rest Diet: routine Medications: PNV, Ibuprofen and Percocet Condition: stable Instructions: See discharge instructions. Discharge to: home Follow-up Information    Follow up with Geryl RankinsVARNADO, Tanesha Arambula, MD. Schedule an appointment as soon as possible for a visit in 2 weeks.   Specialty:  Obstetrics and Gynecology   Why:  For wound re-check   Contact information:   [address] [place] [address] [phone]       Newborn Data: Live born female  Birth Weight: 7 lb 3.7 oz (3280 g) APGAR: 7, 9  Home with mother.  Rachel Lynch, Rushawn Capshaw

## 2014-07-18 NOTE — Discharge Instructions (Signed)

## 2014-07-18 NOTE — Progress Notes (Signed)
Subjective: Postop Day 2: Cesarean Delivery No complaints.  Pain controlled.  Lochia normal.  Breast feeding yes.  Pt with c/o being distended.  Has not passed a lot of gas, no BM.  Denies n/v.  Pt has not ambulated in halls, baby is cluster feeding.  Pt agreeable to hold discharge today.  Objective: Temp:  [97.6 F (36.4 C)-98.1 F (36.7 C)] 98.1 F (36.7 C) (04/12 0627) Pulse Rate:  [67-73] 67 (04/12 0627) Resp:  [18] 18 (04/12 0627) BP: (103-126)/(74-77) 103/74 mmHg (04/12 [id number])  Physical Exam: Gen: NAD Abdomen:  More distended but nontender Lochia: Not visualized Uterine Fundus: firm, appropriately tender Incision: Small old blood on honeycomb dressing, stable. DVT Evaluation: +1 Edema present, no calf tenderness bilaterally    Recent Labs  07/17/14 0730  HGB 10.7*  HCT 30.6*    Assessment/Plan: Status post C-section-doing well postoperatively.  Afebrile. Abdominal distention.  Do not suspect ilieus.  Scheduled Simethicone, Dulcolax suppository.  Ambulation in halls.  Discussed POC with RN Morrie Sheldonshley to help pt with baby, pain control so she can ambulate more. Lactation support. Dr. Richardson Doppole covering after 1 pm.      Rachel Lynch, Rachel Lynch 07/18/2014, 3:24 PM

## 2014-07-19 ENCOUNTER — Ambulatory Visit: Payer: Self-pay

## 2014-07-19 NOTE — Lactation Note (Signed)
This note was copied from the chart of Rachel Lynch. Lactation Consultation Note  Patient Name: Rachel Vito BergerShanel Weins ZOXWR'UToday's Date: 07/19/2014 Reason for consult: Follow-up assessment Baby cluster feeding today. Assisted Mom with positioning and obtaining more depth with latch. Baby is keeping mouth tight, has small mouth and not obtaining a good wide gape with latch. Lips tucking intermittently. Mom has large, long nipples. After several attempts and changing  baby to football position, she was able to obtain more depth, some swallows noted with nursing. Mom denies any discomfort with baby nursing. Mom's breasts are starting to fill, not engorged. Reviewed with Mom how to un-tuck the upper/lower lip to obtain more depth with latch. Advised Mom to be sure baby is at the breast 8-12 times in 24 hours and with feeding ques, nursing for 15-20 minutes both breasts each feeding when possible. If baby continues to want to BF every hour, encouraged Mom to post pump and give baby back any amount of EBM she receives. Engorgement care has been reviewed with Mom. Encouraged to call for questions/concerns.   Maternal Data    Feeding Feeding Type: Breast Fed  LATCH Score/Interventions Latch: Grasps breast easily, tongue down, lips flanged, rhythmical sucking. Intervention(s): Adjust position;Assist with latch;Breast massage;Breast compression  Audible Swallowing: A few with stimulation  Type of Nipple: Everted at rest and after stimulation  Comfort (Breast/Nipple): Filling, red/small blisters or bruises, mild/mod discomfort  Problem noted: Filling Interventions (Filling): Hand pump  Hold (Positioning): Assistance needed to correctly position infant at breast and maintain latch.  LATCH Score: 7  Lactation Tools Discussed/Used Tools: Pump Breast pump type: Manual   Consult Status Consult Status: Complete Date: 07/19/14 Follow-up type: In-patient    Alfred LevinsGranger, Briscoe Daniello Ann 07/19/2014, 4:08  PM

## 2014-07-19 NOTE — Lactation Note (Signed)
This note was copied from the chart of [name]. Lactation Consultation Note  Patient Name: [name]'XToday's Date: 07/19/2014 Reason for consult: Follow-up assessment  Baby currently on the breast but at the end of the feeding. Mom reports her milk is coming in, denies engorgement. Engorgement care reviewed if needed. Mom reports she feels baby is latching better now, obtaining more depth with latch, she reports hearing swallows with baby at the breast. When LC arrived for this visit, baby slowing down with the feeding, no swallows noted.  LC advised Mom baby should be at the breast 8-12 times in 24 hours and with feeding ques. Encouraged to try to BF both breasts each feeding when possible. Mom reports some mild nipple tenderness, advised to apply EBM and Mom has comfort gels. Advised of OP services and support group. LC left phone number for Mom to call with next feeding to observe latch due to 8% weight loss.  Maternal Data    Feeding Feeding Type: Breast Fed Length of feed: 15 min  LATCH Score/Interventions                      Lactation Tools Discussed/Used     Consult Status Consult Status: Follow-up Date: 07/19/14 Follow-up type: In-patient    Alfred LevinsGranger, Tayton Decaire Ann 07/19/2014, 8:38 AM

## 2014-09-06 ENCOUNTER — Other Ambulatory Visit: Payer: Self-pay | Admitting: Obstetrics and Gynecology

## 2014-09-06 ENCOUNTER — Other Ambulatory Visit (HOSPITAL_COMMUNITY)
Admission: RE | Admit: 2014-09-06 | Discharge: 2014-09-06 | Disposition: A | Discharge status: Home / Self Care | Payer: 59 | Source: Ambulatory Visit | Attending: Obstetrics and Gynecology | Admitting: Obstetrics and Gynecology

## 2014-09-06 DIAGNOSIS — R87810 Cervical high risk human papillomavirus (HPV) DNA test positive: Secondary | ICD-10-CM | POA: Insufficient documentation

## 2014-09-06 DIAGNOSIS — Z01411 Encounter for gynecological examination (general) (routine) with abnormal findings: Secondary | ICD-10-CM | POA: Insufficient documentation

## 2014-09-06 DIAGNOSIS — Z1151 Encounter for screening for human papillomavirus (HPV): Secondary | ICD-10-CM | POA: Diagnosis present

## 2014-09-07 LAB — CYTOLOGY - PAP

## 2014-09-25 ENCOUNTER — Other Ambulatory Visit: Payer: Self-pay | Admitting: Obstetrics and Gynecology

## 2014-10-10 ENCOUNTER — Ambulatory Visit (HOSPITAL_COMMUNITY)
Admission: RE | Admit: 2014-10-10 | Discharge: 2014-10-10 | Disposition: A | Discharge status: Home / Self Care | Payer: 59 | Source: Ambulatory Visit | Attending: Obstetrics and Gynecology | Admitting: Obstetrics and Gynecology

## 2014-10-10 NOTE — Lactation Note (Signed)
Lactation Consult for Lakie Tosh & Jonna Munroarter Brown (DOB: [birth year])  Mother's reason for visit: "cracked nipples, not healing" Consult:  Initial Lactation Consultant:  Remigio Eisenmengerichey, Maquita Sandoval Hamilton  ________________________________________________________________________ BW: 3280g (7# 3.7oz) Wt: 11# 4 oz (10-05-14)   Today's weight: 11# 5.6oz ________________________________________________________________________  Mother's Name: Lashawnta S Buhman Breastfeeding Experience:  Successfully breastfed 2 other children (for 18 & 12 months, respectively). Maternal Medical Conditions:  None ________________________________________________________________________  Breastfeeding History (Post Discharge)  Frequency of breastfeeding:q2-3h Duration of feeding: 10-4015min each side  Pumping  Type of pump:  Medela pump in style Frequency: once per 8-hr shift Volume: 210 ml (total)  Infant Intake and Output Assessment  Voids: 5-6 in 24 hrs.  Color:  Clear yellow Stools:  1-2 in 24 hrs.  Color:  Yellow  ________________________________________________________________________  Maternal Breast Assessment  Breast:  Compressible Nipple:  Erect   _____________________________________________________________________ Feeding Assessment/Evaluation  Initial feeding assessment:  Positioning:  Laid back Left breast   Attached assessment:  Shallow initially, but then becomes deeper  Lips flanged:  No, but is then able to flange. There may be a slight frenum, but baby is able to flange upper lip.    Tools:  Shells Instructed on use and cleaning of tool:  Yes.    Pre-feed weight: 5146 g   Post-feed weight: 5174 g  Amount transferred: 28 ml Total amount transferred: 28 ml  Montez MoritaCarter is 4112 weeks old and is slightly more than 4 lbs above BW.   Mom has an abundant supply. Montez MoritaCarter has learned that she can receive an adequate amount of milk even with a narrow gape and sometimes unflanged lips (this may  also be Carter's way of controlling the flow). During the consult, we worked on PACCAR Inclaid-back nursing, getting a true asymmetrical latch, lowering the chin if needed and watching for flanging of the lips.   Mom's L nipple has shown some improvement since she changed her pump flanges to a size 27 & began using Comfort Gels last week.  However, Mom feels there has been little to no improvement on her R nipple. On her L nipple, there is a small crack at 3 o'clock. On her R nipple, there is a deeper crack at 12 o'clock.   Mom encouraged to try the above measures to assist w/latch and to pre-pump a few moments if she feels that her initial letdown is contributing to the narrow gape. Mom also encouraged to pump more often at work (during the day there is a 7-hr time span where she neither feeds nor expresses her milk).   Mom provided sore-nipple shells to promote healing. She may use coconut oil on her nipples. Mom also given a fresh pair of Comfort Gels (mom is aware not to wear any nipple ointments with shells).   Call if no improvement within 7 days.

## 2014-11-06 HISTORY — PX: LEEP: SHX91

## 2014-11-28 ENCOUNTER — Other Ambulatory Visit: Payer: Self-pay | Admitting: Obstetrics and Gynecology

## 2015-03-29 LAB — OB RESULTS CONSOLE ANTIBODY SCREEN: Antibody Screen: NEGATIVE

## 2015-03-29 LAB — OB RESULTS CONSOLE HIV ANTIBODY (ROUTINE TESTING): HIV: NONREACTIVE

## 2015-03-29 LAB — OB RESULTS CONSOLE ABO/RH: RH Type: POSITIVE

## 2015-03-29 LAB — OB RESULTS CONSOLE GC/CHLAMYDIA
Chlamydia: NEGATIVE
GC PROBE AMP, GENITAL: NEGATIVE

## 2015-03-29 LAB — OB RESULTS CONSOLE RPR: RPR: NONREACTIVE

## 2015-03-29 LAB — OB RESULTS CONSOLE HEPATITIS B SURFACE ANTIGEN: Hepatitis B Surface Ag: NEGATIVE

## 2015-03-29 LAB — OB RESULTS CONSOLE RUBELLA ANTIBODY, IGM: Rubella: IMMUNE

## 2015-05-10 ENCOUNTER — Other Ambulatory Visit: Payer: Self-pay | Admitting: Obstetrics and Gynecology

## 2015-05-10 ENCOUNTER — Other Ambulatory Visit (HOSPITAL_COMMUNITY)
Admission: RE | Admit: 2015-05-10 | Discharge: 2015-05-10 | Disposition: A | Discharge status: Home / Self Care | Payer: 59 | Source: Ambulatory Visit | Attending: Obstetrics and Gynecology | Admitting: Obstetrics and Gynecology

## 2015-05-10 DIAGNOSIS — Z1151 Encounter for screening for human papillomavirus (HPV): Secondary | ICD-10-CM | POA: Insufficient documentation

## 2015-05-10 DIAGNOSIS — Z01419 Encounter for gynecological examination (general) (routine) without abnormal findings: Secondary | ICD-10-CM | POA: Insufficient documentation

## 2015-05-15 LAB — CYTOLOGY - PAP

## 2015-07-10 ENCOUNTER — Other Ambulatory Visit: Payer: Self-pay | Admitting: Obstetrics and Gynecology

## 2015-07-10 DIAGNOSIS — Z3480 Encounter for supervision of other normal pregnancy, unspecified trimester: Secondary | ICD-10-CM

## 2015-07-10 DIAGNOSIS — Z3689 Encounter for other specified antenatal screening: Secondary | ICD-10-CM

## 2015-07-12 ENCOUNTER — Encounter (HOSPITAL_COMMUNITY): Payer: Self-pay | Admitting: *Deleted

## 2015-07-12 ENCOUNTER — Inpatient Hospital Stay (HOSPITAL_COMMUNITY)
Admission: AD | Admit: 2015-07-12 | Discharge: 2015-07-12 | Disposition: A | Discharge status: Home / Self Care | Payer: BC Managed Care – PPO | Source: Ambulatory Visit | Attending: Obstetrics & Gynecology | Admitting: Obstetrics & Gynecology

## 2015-07-12 ENCOUNTER — Inpatient Hospital Stay (HOSPITAL_COMMUNITY): Payer: BC Managed Care – PPO

## 2015-07-12 DIAGNOSIS — N949 Unspecified condition associated with female genital organs and menstrual cycle: Secondary | ICD-10-CM

## 2015-07-12 DIAGNOSIS — R109 Unspecified abdominal pain: Secondary | ICD-10-CM

## 2015-07-12 DIAGNOSIS — Z8249 Family history of ischemic heart disease and other diseases of the circulatory system: Secondary | ICD-10-CM | POA: Insufficient documentation

## 2015-07-12 DIAGNOSIS — O26892 Other specified pregnancy related conditions, second trimester: Secondary | ICD-10-CM | POA: Insufficient documentation

## 2015-07-12 DIAGNOSIS — R102 Pelvic and perineal pain: Secondary | ICD-10-CM | POA: Diagnosis not present

## 2015-07-12 DIAGNOSIS — O9989 Other specified diseases and conditions complicating pregnancy, childbirth and the puerperium: Secondary | ICD-10-CM | POA: Diagnosis not present

## 2015-07-12 DIAGNOSIS — Z3A21 21 weeks gestation of pregnancy: Secondary | ICD-10-CM | POA: Insufficient documentation

## 2015-07-12 DIAGNOSIS — Z833 Family history of diabetes mellitus: Secondary | ICD-10-CM | POA: Insufficient documentation

## 2015-07-12 DIAGNOSIS — Z8489 Family history of other specified conditions: Secondary | ICD-10-CM | POA: Insufficient documentation

## 2015-07-12 DIAGNOSIS — O26899 Other specified pregnancy related conditions, unspecified trimester: Secondary | ICD-10-CM

## 2015-07-12 DIAGNOSIS — O3442 Maternal care for other abnormalities of cervix, second trimester: Secondary | ICD-10-CM

## 2015-07-12 DIAGNOSIS — O34219 Maternal care for unspecified type scar from previous cesarean delivery: Secondary | ICD-10-CM | POA: Insufficient documentation

## 2015-07-12 DIAGNOSIS — Z9889 Other specified postprocedural states: Secondary | ICD-10-CM

## 2015-07-12 LAB — WET PREP, GENITAL
Clue Cells Wet Prep HPF POC: NONE SEEN
Sperm: NONE SEEN
Trich, Wet Prep: NONE SEEN

## 2015-07-12 LAB — POCT PREGNANCY, URINE: Preg Test, Ur: POSITIVE — AB

## 2015-07-12 NOTE — Discharge Instructions (Signed)

## 2015-07-12 NOTE — MAU Note (Addendum)
PT SAYS  SHE  FEELS  CRAMPS  -  AND  BACK PAIN  AND  PRESSURE -   ALL STAERTED  LAST NIGHT  AND  THIS  MORM     LAST SEX-   Sunday.     DENIES HSV AND MRSA         SAYS  SHE HAD  LEEP IN    SEPT.

## 2015-07-12 NOTE — MAU Provider Note (Signed)
History     CSN: [id number]  Arrival date and time: 07/12/15 2105   First Provider Initiated Contact with Patient 07/12/15 2233      Chief Complaint  Patient presents with  . Pelvic Pain   HPI Comments: Rachel Lynch is a 25 y.o. [id number] at [redacted]w[redacted]d who presents today with lower abdominal cramping. She states that it started yesterday. She reports that she had a LEEP done in August of 2016. She reports that she had an [place] and cervical length done in the office about 2 weeks. She is unsure of the measurement, but states that she thinks she remembers that the "length was good".   Pelvic Pain The patient's primary symptoms include pelvic pain. This is a new problem. The current episode started today. The problem occurs intermittently. The problem has been unchanged. Pain severity now: 6/10  The problem affects both sides. She is pregnant. Associated symptoms include abdominal pain and dysuria. Pertinent negatives include no chills, constipation, diarrhea, fever, frequency, nausea, urgency or vomiting. The vaginal discharge was normal. There has been no bleeding. Nothing aggravates the symptoms. She has tried nothing for the symptoms.    Past Medical History  Diagnosis Date  . Vaginal Pap smear, abnormal     Past Surgical History  Procedure Laterality Date  . No past surgeries    . Cesarean section N/A 07/16/2014    Procedure: CESAREAN SECTION;  Surgeon: Geryl Rankins, MD;  Location: [facility];  Service: Obstetrics;  Laterality: N/A;  . Leep  11/2014    Family History  Problem Relation Age of Onset  . Diabetes Maternal Uncle   . Diabetes Maternal Grandmother   . Hypertension Paternal Grandmother   . Clotting disorder Paternal Grandmother     Social History  Substance Use Topics  . Smoking status: Never Smoker   . Smokeless tobacco: None  . Alcohol Use: No    Allergies: No Known Allergies  Prescriptions prior to admission  Medication Sig Dispense Refill Last Dose  . ibuprofen  (ADVIL,MOTRIN) 600 MG tablet Take 1 tablet (600 mg total) by mouth every 6 (six) hours. 30 tablet 1   . oxyCODONE-acetaminophen (PERCOCET/ROXICET) 5-325 MG per tablet 1-2 tablets by mouth every 4-6 hours as needed for pain. 30 tablet 0   . Prenatal Vit-Fe Fumarate-FA (PRENATAL MULTIVITAMIN) TABS tablet Take 1 tablet by mouth daily at 12 noon.   07/14/2014 at Unknown time    Review of Systems  Constitutional: Negative for fever and chills.  Gastrointestinal: Positive for abdominal pain. Negative for nausea, vomiting, diarrhea and constipation.  Genitourinary: Positive for dysuria and pelvic pain. Negative for urgency and frequency.   Physical Exam   Blood pressure 122/73, pulse 90, temperature 98.2 F (36.8 C), temperature source Oral, resp. rate 18, height  (1.575 m), weight 73.086 kg (161 lb 2 oz), unknown if currently breastfeeding.  Physical Exam  Nursing note and vitals reviewed. Constitutional: She is oriented to person, place, and time. She appears well-developed and well-nourished. No distress.  HENT:  Head: Normocephalic.  Cardiovascular: Normal rate.   Respiratory: Effort normal.  GI: Soft. There is no tenderness. There is no rebound.  Genitourinary:   External: no lesion Vagina: small amount of white discharge Cervix: pink, smooth, closed/thick  Uterus: AGA. FHT with doppler 156    Neurological: She is alert and oriented to person, place, and time.  Skin: Skin is warm and dry.  Psychiatric: She has a normal mood and affect.   [place]: cervical length  3.7cm  Toco:  No UCs seen  MAU Course  Procedures  MDM 2339: D/W Dr. Peterson Aoivard,ok for DC home can take ibuprofen for 48 hours to help with the cramping.   Assessment and Plan   1. Abdominal pain affecting pregnancy   2. History of LEEP (loop electrosurgical excision procedure) of cervix complicating pregnancy, second trimester   3. [redacted] weeks gestation of pregnancy   4. Round ligament pain    DC home Comfort measures  reviewed  2nd Trimester precautions  PTL precautions  Fetal kick counts RX: none  Return to MAU as needed FU with OB as planned  Follow-up Information    Follow up with Myna HidalgoZAN, JENNIFER, M, DO.   Specialty:  Obstetrics and Gynecology   Why:  As scheduled   Contact information:   [address] [place] [address] [phone]       Tawnya CrookHogan, Shawnika Pepin Donovan 07/12/2015, 10:35 PM

## 2015-07-13 LAB — GC/CHLAMYDIA PROBE AMP (~~LOC~~) NOT AT ARMC
CHLAMYDIA, DNA PROBE: NEGATIVE
Neisseria Gonorrhea: NEGATIVE

## 2015-07-17 ENCOUNTER — Ambulatory Visit (HOSPITAL_COMMUNITY)
Admission: RE | Admit: 2015-07-17 | Discharge: 2015-07-17 | Disposition: A | Discharge status: Home / Self Care | Payer: BC Managed Care – PPO | Source: Ambulatory Visit | Attending: Obstetrics and Gynecology | Admitting: Obstetrics and Gynecology

## 2015-07-17 ENCOUNTER — Other Ambulatory Visit: Payer: Self-pay | Admitting: Obstetrics and Gynecology

## 2015-07-17 DIAGNOSIS — O34219 Maternal care for unspecified type scar from previous cesarean delivery: Secondary | ICD-10-CM | POA: Diagnosis not present

## 2015-07-17 DIAGNOSIS — Z3689 Encounter for other specified antenatal screening: Secondary | ICD-10-CM

## 2015-07-17 DIAGNOSIS — Z36 Encounter for antenatal screening of mother: Secondary | ICD-10-CM | POA: Diagnosis not present

## 2015-07-17 DIAGNOSIS — Z3A22 22 weeks gestation of pregnancy: Secondary | ICD-10-CM

## 2015-07-17 DIAGNOSIS — Z3480 Encounter for supervision of other normal pregnancy, unspecified trimester: Secondary | ICD-10-CM

## 2015-08-21 ENCOUNTER — Encounter (HOSPITAL_BASED_OUTPATIENT_CLINIC_OR_DEPARTMENT_OTHER): Payer: Self-pay | Admitting: *Deleted

## 2015-08-21 ENCOUNTER — Emergency Department (HOSPITAL_BASED_OUTPATIENT_CLINIC_OR_DEPARTMENT_OTHER): Payer: BC Managed Care – PPO

## 2015-08-21 ENCOUNTER — Emergency Department (HOSPITAL_BASED_OUTPATIENT_CLINIC_OR_DEPARTMENT_OTHER)
Admission: EM | Admit: 2015-08-21 | Discharge: 2015-08-21 | Disposition: A | Discharge status: Home / Self Care | Payer: BC Managed Care – PPO | Attending: Emergency Medicine | Admitting: Emergency Medicine

## 2015-08-21 DIAGNOSIS — Z3A27 27 weeks gestation of pregnancy: Secondary | ICD-10-CM | POA: Insufficient documentation

## 2015-08-21 DIAGNOSIS — Z792 Long term (current) use of antibiotics: Secondary | ICD-10-CM | POA: Insufficient documentation

## 2015-08-21 DIAGNOSIS — X58XXXA Exposure to other specified factors, initial encounter: Secondary | ICD-10-CM | POA: Diagnosis not present

## 2015-08-21 DIAGNOSIS — Y929 Unspecified place or not applicable: Secondary | ICD-10-CM | POA: Insufficient documentation

## 2015-08-21 DIAGNOSIS — S86811A Strain of other muscle(s) and tendon(s) at lower leg level, right leg, initial encounter: Secondary | ICD-10-CM | POA: Insufficient documentation

## 2015-08-21 DIAGNOSIS — Y999 Unspecified external cause status: Secondary | ICD-10-CM | POA: Insufficient documentation

## 2015-08-21 DIAGNOSIS — O9A212 Injury, poisoning and certain other consequences of external causes complicating pregnancy, second trimester: Secondary | ICD-10-CM | POA: Diagnosis present

## 2015-08-21 DIAGNOSIS — T148XXA Other injury of unspecified body region, initial encounter: Secondary | ICD-10-CM

## 2015-08-21 DIAGNOSIS — Z79899 Other long term (current) drug therapy: Secondary | ICD-10-CM | POA: Insufficient documentation

## 2015-08-21 DIAGNOSIS — Y939 Activity, unspecified: Secondary | ICD-10-CM | POA: Insufficient documentation

## 2015-08-21 NOTE — ED Provider Notes (Signed)
CSN: [id number]     Arrival date & time 08/21/15  2028 History   First MD Initiated Contact with Patient 08/21/15 2041     Chief Complaint  Patient presents with  . Leg Pain   (Consider location/radiation/quality/duration/timing/severity/associated sxs/prior Treatment) HPI 25 y.o. female [phone] presents to the Emergency Department today complaining right lower calf pain since last night. Notes tenderness to palpation and slight swelling pain is 5-6/10. Worse with ambulation. No hx DVT/PE. No trauma to the area. No CP/SPB/ABD pain. No fevers. No headaches. No N/V/D. No vaginal bleeding/discharge. No other symptoms noted. Pt is due in August.   Past Medical History  Diagnosis Date  . Vaginal Pap smear, abnormal    Past Surgical History  Procedure Laterality Date  . No past surgeries    . Cesarean section N/A 07/16/2014    Procedure: CESAREAN SECTION;  Surgeon: Geryl RankinsEvelyn Varnado, MD;  Location: [facility];  Service: Obstetrics;  Laterality: N/A;  . Leep  11/2014   Family History  Problem Relation Age of Onset  . Diabetes Maternal Uncle   . Diabetes Maternal Grandmother   . Hypertension Paternal Grandmother   . Clotting disorder Paternal Grandmother    Social History  Substance Use Topics  . Smoking status: Never Smoker   . Smokeless tobacco: None  . Alcohol Use: No   OB History    Gravida Para Term Preterm AB TAB SAB Ectopic Multiple Living   6 3 3  2  1   0 3     Review of Systems ROS reviewed and all are negative for acute change except as noted in the HPI.  Allergies  Review of patient's allergies indicates no known allergies.  Home Medications   Prior to Admission medications   Medication Sig Start Date End Date Taking? Authorizing Provider  amoxicillin (AMOXIL) 500 MG capsule Take 500 mg by mouth 3 (three) times daily.   Yes Historical Provider, MD  Doxylamine-Pyridoxine (DICLEGIS) 10-10 MG TBEC Take by mouth.   Yes Historical Provider, MD  ibuprofen (ADVIL,MOTRIN) 600 MG  tablet Take 1 tablet (600 mg total) by mouth every 6 (six) hours. 07/18/14   Geryl RankinsEvelyn Varnado, MD  Prenatal Vit-Fe Fumarate-FA (PRENATAL MULTIVITAMIN) TABS tablet Take 1 tablet by mouth daily at 12 noon.    Historical Provider, MD   BP 118/88 mmHg  Pulse 111  Temp(Src) 97.9 F (36.6 C)  Resp 18  Ht 5\' 2"  (1.575 m)  Wt 73.936 kg  BMI 29.81 kg/m2  SpO2 99%   Physical Exam  Constitutional: She is oriented to person, place, and time. She appears well-developed and well-nourished.  HENT:  Head: Normocephalic and atraumatic.  Eyes: EOM are normal. Pupils are equal, round, and reactive to light.  Neck: Normal range of motion. Neck supple.  Cardiovascular: Normal rate, regular rhythm, normal heart sounds and intact distal pulses.   No murmur heard. Pulmonary/Chest: Effort normal and breath sounds normal. No respiratory distress. She has no wheezes. She has no rales. She exhibits no tenderness.  Abdominal: Soft.  Musculoskeletal: Normal range of motion.  Right posterior calf tenderness. Mild swelling noted. No erythema. No signs of infection. Distal pulses intact. Motor/sensation intact. Able to ambulate   Neurological: She is alert and oriented to person, place, and time.  Skin: Skin is warm and dry.  Psychiatric: She has a normal mood and affect. Her behavior is normal. Thought content normal.  Nursing note and vitals reviewed.  ED Course  Procedures (including critical care time) Labs Review Labs  Reviewed - No data to display  Imaging Review US Venous Img Lower Unilateral Right  08/21/2015  CLINICAL DATA:  Pain in the right mid calf for 2 days. Patient is [redacted] weeks pregnant. EXAM: Right LOWER EXTREMITY VENOUS DOPPLER ULTRASOUND TECHNIQUE: Gray-scale sonography with graded compression, as well as color Doppler and duplex ultrasound were performed to evaluate the lower extremity deep venous systems from the level of the common femoral vein and including the common femoral, femoral, profunda  femoral, popliteal and calf veins including the posterior tibial, peroneal and gastrocnemius veins when visible. The superficial great saphenous vein was also interrogated. Spectral Doppler was utilized to evaluate flow at rest and with distal augmentation maneuvers in the common femoral, femoral and popliteal veins. COMPARISON:  None. FINDINGS: Contralateral Common Femoral Vein: Respiratory phasicity is normal and symmetric with the symptomatic side. No evidence of thrombus. Normal compressibility. Common Femoral Vein: No evidence of thrombus. Normal compressibility, respiratory phasicity and response to augmentation. Saphenofemoral Junction: No evidence of thrombus. Normal compressibility and flow on color Doppler imaging. Profunda Femoral Vein: No evidence of thrombus. Normal compressibility and flow on color Doppler imaging. Femoral Vein: No evidence of thrombus. Normal compressibility, respiratory phasicity and response to augmentation. Popliteal Vein: No evidence of thrombus. Normal compressibility, respiratory phasicity and response to augmentation. Calf Veins: No evidence of thrombus. Normal compressibility and flow on color Doppler imaging. Superficial Great Saphenous Vein: No evidence of thrombus. Normal compressibility and flow on color Doppler imaging. Venous Reflux:  None. Other Findings:  None. IMPRESSION: No evidence of deep venous thrombosis. Electronically Signed   By: Burman Nieves M.D.   On: 08/21/2015 22:13   I have personally reviewed and evaluated these images and lab results as part of my medical decision-making.   EKG Interpretation None      MDM  I have reviewed and evaluated the relevant imaging studies.  I have reviewed the relevant previous healthcare records.I obtained HPI from historian. Patient discussed with supervising physician  ED Course:  Assessment: Pt is a 24yF [id number] who presents with right calf pain. On exam, pt in NAD. Nontoxic/nonseptic appearing. VS with  tachycardia. O2 sat 99% RA. Afebrile. Lungs CTA. Heart RRR. Abdomen nontender soft. Tenderness to Right calf. Minor swelling. No erythema. Pt does not endorse any CP/SOB. [place] LE no evidence of DVT. Likely muscle strain. Plan is to DC Home with follow up to PCP. At time of discharge, Patient is in no acute distress. Vital Signs are stable. Patient is able to ambulate. Patient able to tolerate PO.    Disposition/Plan:  DC Home Additional Verbal discharge instructions given and discussed with patient.  Pt Instructed to f/u with PCP in the next week for evaluation and treatment of symptoms. Return precautions given Pt acknowledges and agrees with plan  Supervising Physician Gwyneth Sprout, MD   Final diagnoses:  Muscle strain       Audry Pili, PA-C 08/21/15 2329  Gwyneth Sprout, MD 08/22/15 0006

## 2015-08-21 NOTE — ED Notes (Signed)
Pt c/o right calf pain x 2 days w/o injury, pregnant [redacted] weeks

## 2015-08-21 NOTE — Discharge Instructions (Signed)
Please read and follow all provided instructions.  Your diagnoses today include:  1. Muscle strain    Tests performed today include:  Vital signs. See below for your results today.   Medications prescribed:   None  Home care instructions:  Follow any educational materials contained in this packet  *PRICE in the first 24-48 hours after injury: Protect (with brace, splint, sling), if given by your provider Rest Ice- Do not apply ice pack directly to your skin, place towel or similar between your skin and ice/ice pack. Apply ice for 20 min, then remove for 40 min while awake Compression- Wear brace, elastic bandage, splint as directed by your provider Elevate affected extremity above the level of your heart when not walking around for the first 24-48 hours   Follow-up instructions: Please follow-up with your primary care provider in the next week for further evaluation of symptoms and treatment   Return instructions:   Please return to the Emergency Department if you do not get better, if you get worse, or new symptoms OR  - Fever (temperature greater than 101.62F)  - Bleeding that does not stop with holding pressure to the area    -Severe pain (please note that you may be more sore the day after your accident)  - Chest Pain  - Difficulty breathing  - Severe nausea or vomiting  - Inability to tolerate food and liquids  - Passing out  - Skin becoming red around your wounds  - Change in mental status (confusion or lethargy)  - New numbness or weakness     Please return if you have any other emergent concerns.  Additional Information:  Your vital signs today were: BP 118/88 mmHg   Pulse 111   Temp(Src) 97.9 F (36.6 C)   Resp 18   Ht 5\' 2"  (1.575 m)   Wt 73.936 kg   BMI 29.81 kg/m2   SpO2 99% If your blood pressure (BP) was elevated above 135/85 this visit, please have this repeated by your doctor within one month. ---------------

## 2015-08-21 NOTE — ED Notes (Signed)
PA at bedside for pt eval.

## 2015-08-21 NOTE — ED Notes (Signed)
Patient transported to Ultrasound 

## 2015-11-02 ENCOUNTER — Telehealth (HOSPITAL_COMMUNITY): Payer: Self-pay | Admitting: *Deleted

## 2015-11-02 ENCOUNTER — Encounter (HOSPITAL_COMMUNITY): Payer: Self-pay

## 2015-11-02 NOTE — Telephone Encounter (Signed)
Preadmission screen  

## 2015-11-05 ENCOUNTER — Telehealth (HOSPITAL_COMMUNITY): Payer: Self-pay | Admitting: *Deleted

## 2015-11-05 NOTE — Telephone Encounter (Signed)
Preadmission screen Left message with office that I am unable to reach pt.  She has an appointment on Tuesday and the office will relay message to her that she needs to call me before Friday.

## 2015-11-06 ENCOUNTER — Telehealth (HOSPITAL_COMMUNITY): Payer: Self-pay | Admitting: *Deleted

## 2015-11-06 ENCOUNTER — Encounter (HOSPITAL_COMMUNITY): Payer: Self-pay

## 2015-11-06 NOTE — Telephone Encounter (Signed)
Preadmission screen  

## 2015-11-06 NOTE — Telephone Encounter (Signed)
email sent on 11/06/2015

## 2015-11-09 ENCOUNTER — Encounter (HOSPITAL_COMMUNITY)
Admission: RE | Admit: 2015-11-09 | Discharge: 2015-11-09 | Disposition: A | Discharge status: Home / Self Care | Payer: BC Managed Care – PPO | Source: Ambulatory Visit | Attending: Obstetrics and Gynecology | Admitting: Obstetrics and Gynecology

## 2015-11-09 HISTORY — DX: Personal history of other infectious and parasitic diseases: Z86.19

## 2015-11-09 HISTORY — DX: Nontoxic goiter, unspecified: E04.9

## 2015-11-09 HISTORY — DX: Asymptomatic varicose veins of unspecified lower extremity: I83.90

## 2015-11-09 HISTORY — DX: Headache, unspecified: R51.9

## 2015-11-09 HISTORY — DX: Headache: R51

## 2015-11-09 LAB — CBC
HCT: 34.6 % — ABNORMAL LOW (ref 36.0–46.0)
Hemoglobin: 11.8 g/dL — ABNORMAL LOW (ref 12.0–15.0)
MCH: 30 pg (ref 26.0–34.0)
MCHC: 34.1 g/dL (ref 30.0–36.0)
MCV: 88 fL (ref 78.0–100.0)
PLATELETS: 277 10*3/uL (ref 150–400)
RBC: 3.93 MIL/uL (ref 3.87–5.11)
RDW: 14 % (ref 11.5–15.5)
WBC: 11 10*3/uL — ABNORMAL HIGH (ref 4.0–10.5)

## 2015-11-09 LAB — TYPE AND SCREEN
ABO/RH(D): B POS
ANTIBODY SCREEN: NEGATIVE

## 2015-11-09 NOTE — Patient Instructions (Signed)
20 Leilynn S Ebeling  11/09/2015   Your procedure is scheduled on:  11/12/2015  Enter through the Main Entrance of [facility] at 0600 AM.  Pick up the phone at the desk and dial [phone].   Call this number if you have problems the morning of surgery: [phone]   Remember:   Do not eat food:After Midnight.  Do not drink clear liquids: After Midnight.  Take these medicines the morning of surgery with A SIP OF WATER: none   Do not wear jewelry, make-up or nail polish.  Do not wear lotions, powders, or perfumes. You may wear deodorant.  Do not shave 48 hours prior to surgery.  Do not bring valuables to the hospital.  [facility] is not   responsible for any belongings or valuables brought to the hospital.  Contacts, dentures or bridgework may not be worn into surgery.  Leave suitcase in the car. After surgery it may be brought to your room.  For patients admitted to the hospital, checkout time is 11:00 AM the day of              discharge.   Patients discharged the day of surgery will not be allowed to drive             home.  Name and phone number of your driver: na  Special Instructions:   N/A   Please read over the following fact sheets that you were given:   Surgical Site Infection Prevention

## 2015-11-10 LAB — RPR: RPR Ser Ql: NONREACTIVE

## 2015-11-12 ENCOUNTER — Inpatient Hospital Stay (HOSPITAL_COMMUNITY): Payer: BC Managed Care – PPO | Admitting: Anesthesiology

## 2015-11-12 ENCOUNTER — Encounter (HOSPITAL_COMMUNITY)
Admission: RE | Disposition: A | Discharge status: Home / Self Care | Payer: Self-pay | Source: Ambulatory Visit | Attending: Obstetrics and Gynecology

## 2015-11-12 ENCOUNTER — Encounter (HOSPITAL_COMMUNITY): Payer: Self-pay | Admitting: Anesthesiology

## 2015-11-12 ENCOUNTER — Inpatient Hospital Stay (HOSPITAL_COMMUNITY)
Admission: RE | Admit: 2015-11-12 | Discharge: 2015-11-14 | DRG: 766 | Disposition: A | Discharge status: Home / Self Care | Payer: BC Managed Care – PPO | Source: Ambulatory Visit | Attending: Obstetrics and Gynecology | Admitting: Obstetrics and Gynecology

## 2015-11-12 DIAGNOSIS — Z3A38 38 weeks gestation of pregnancy: Secondary | ICD-10-CM

## 2015-11-12 DIAGNOSIS — Z8249 Family history of ischemic heart disease and other diseases of the circulatory system: Secondary | ICD-10-CM

## 2015-11-12 DIAGNOSIS — Z302 Encounter for sterilization: Secondary | ICD-10-CM

## 2015-11-12 DIAGNOSIS — E669 Obesity, unspecified: Secondary | ICD-10-CM | POA: Diagnosis present

## 2015-11-12 DIAGNOSIS — Z833 Family history of diabetes mellitus: Secondary | ICD-10-CM

## 2015-11-12 DIAGNOSIS — Z98891 History of uterine scar from previous surgery: Secondary | ICD-10-CM

## 2015-11-12 DIAGNOSIS — O99214 Obesity complicating childbirth: Secondary | ICD-10-CM | POA: Diagnosis present

## 2015-11-12 DIAGNOSIS — Z6830 Body mass index (BMI) 30.0-30.9, adult: Secondary | ICD-10-CM

## 2015-11-12 DIAGNOSIS — O34211 Maternal care for low transverse scar from previous cesarean delivery: Principal | ICD-10-CM | POA: Diagnosis present

## 2015-11-12 DIAGNOSIS — O99824 Streptococcus B carrier state complicating childbirth: Secondary | ICD-10-CM | POA: Diagnosis present

## 2015-11-12 HISTORY — PX: BILATERAL SALPINGECTOMY: SHX5743

## 2015-11-12 SURGERY — Surgical Case
Anesthesia: Spinal | Laterality: Bilateral

## 2015-11-12 MED ORDER — OXYTOCIN 40 UNITS IN LACTATED RINGERS INFUSION - SIMPLE MED: 2.5000 [IU]/h | INTRAVENOUS | Status: AC

## 2015-11-12 MED ORDER — DIBUCAINE 1 % RE OINT: 1.0000 "application " | TOPICAL_OINTMENT | RECTAL | Status: DC | PRN

## 2015-11-12 MED ORDER — FENTANYL CITRATE (PF) 100 MCG/2ML IJ SOLN
INTRAMUSCULAR | Status: DC | PRN
  Administered 2015-11-12: 20 ug via INTRATHECAL

## 2015-11-12 MED ORDER — WITCH HAZEL-GLYCERIN EX PADS: 1.0000 "application " | MEDICATED_PAD | CUTANEOUS | Status: DC | PRN

## 2015-11-12 MED ORDER — METHYLERGONOVINE MALEATE 0.2 MG/ML IJ SOLN: 0.2000 mg | INTRAMUSCULAR | Status: DC | PRN

## 2015-11-12 MED ORDER — ACETAMINOPHEN 500 MG PO TABS
1000.0000 mg | ORAL_TABLET | Freq: Four times a day (QID) | ORAL | Status: AC
  Administered 2015-11-12 – 2015-11-13 (×3): 1000 mg via ORAL
  Filled 2015-11-12 (×3): qty 2

## 2015-11-12 MED ORDER — DIPHENHYDRAMINE HCL 50 MG/ML IJ SOLN
12.5000 mg | INTRAMUSCULAR | Status: DC | PRN
  Administered 2015-11-12: 12.5 mg via INTRAVENOUS
  Filled 2015-11-12: qty 1

## 2015-11-12 MED ORDER — METHYLERGONOVINE MALEATE 0.2 MG PO TABS: 0.2000 mg | ORAL_TABLET | ORAL | Status: DC | PRN

## 2015-11-12 MED ORDER — NALBUPHINE HCL 10 MG/ML IJ SOLN: 5.0000 mg | Freq: Once | INTRAMUSCULAR | Status: AC | PRN

## 2015-11-12 MED ORDER — MORPHINE SULFATE (PF) 0.5 MG/ML IJ SOLN
INTRAMUSCULAR | Status: DC | PRN
  Administered 2015-11-12: .2 mg via INTRATHECAL

## 2015-11-12 MED ORDER — NALBUPHINE HCL 10 MG/ML IJ SOLN
5.0000 mg | INTRAMUSCULAR | Status: DC | PRN
  Administered 2015-11-12: 5 mg via INTRAVENOUS
  Filled 2015-11-12 (×2): qty 1

## 2015-11-12 MED ORDER — OXYCODONE-ACETAMINOPHEN 5-325 MG PO TABS: 1.0000 | ORAL_TABLET | ORAL | Status: DC | PRN

## 2015-11-12 MED ORDER — FENTANYL CITRATE (PF) 100 MCG/2ML IJ SOLN
INTRAMUSCULAR | Status: AC
  Filled 2015-11-12: qty 2

## 2015-11-12 MED ORDER — LACTATED RINGERS IV SOLN
INTRAVENOUS | Status: DC
  Administered 2015-11-12 (×2): via INTRAVENOUS

## 2015-11-12 MED ORDER — OXYTOCIN 10 UNIT/ML IJ SOLN
INTRAVENOUS | Status: DC | PRN
  Administered 2015-11-12: 40 [IU] via INTRAVENOUS

## 2015-11-12 MED ORDER — KETOROLAC TROMETHAMINE 30 MG/ML IJ SOLN
INTRAMUSCULAR | Status: AC
  Filled 2015-11-12: qty 1

## 2015-11-12 MED ORDER — ONDANSETRON HCL 4 MG/2ML IJ SOLN: 4.0000 mg | Freq: Three times a day (TID) | INTRAMUSCULAR | Status: DC | PRN

## 2015-11-12 MED ORDER — SIMETHICONE 80 MG PO CHEW: 80.0000 mg | CHEWABLE_TABLET | ORAL | Status: DC | PRN

## 2015-11-12 MED ORDER — LACTATED RINGERS IV SOLN
INTRAVENOUS | Status: DC
  Administered 2015-11-12 – 2015-11-13 (×2): via INTRAVENOUS

## 2015-11-12 MED ORDER — MEPERIDINE HCL 25 MG/ML IJ SOLN: 6.2500 mg | INTRAMUSCULAR | Status: DC | PRN

## 2015-11-12 MED ORDER — ONDANSETRON HCL 4 MG/2ML IJ SOLN
INTRAMUSCULAR | Status: AC
  Filled 2015-11-12: qty 2

## 2015-11-12 MED ORDER — OXYCODONE-ACETAMINOPHEN 5-325 MG PO TABS: 2.0000 | ORAL_TABLET | ORAL | Status: DC | PRN

## 2015-11-12 MED ORDER — OXYCODONE-ACETAMINOPHEN 5-325 MG PO TABS
2.0000 | ORAL_TABLET | ORAL | Status: DC | PRN
  Administered 2015-11-13 – 2015-11-14 (×4): 2 via ORAL
  Filled 2015-11-12 (×3): qty 2

## 2015-11-12 MED ORDER — ONDANSETRON HCL 4 MG/2ML IJ SOLN
INTRAMUSCULAR | Status: DC | PRN
  Administered 2015-11-12: 4 mg via INTRAVENOUS

## 2015-11-12 MED ORDER — IBUPROFEN 600 MG PO TABS: 600.0000 mg | ORAL_TABLET | Freq: Four times a day (QID) | ORAL | Status: DC | PRN

## 2015-11-12 MED ORDER — PHENYLEPHRINE 8 MG IN D5W 100 ML (0.08MG/ML) PREMIX OPTIME
INJECTION | INTRAVENOUS | Status: DC | PRN
  Administered 2015-11-12: 60 ug/min via INTRAVENOUS

## 2015-11-12 MED ORDER — LACTATED RINGERS IV SOLN
INTRAVENOUS | Status: DC
  Administered 2015-11-12: 08:00:00 via INTRAVENOUS

## 2015-11-12 MED ORDER — SIMETHICONE 80 MG PO CHEW
80.0000 mg | CHEWABLE_TABLET | ORAL | Status: DC
  Administered 2015-11-13 – 2015-11-14 (×2): 80 mg via ORAL
  Filled 2015-11-12 (×2): qty 1

## 2015-11-12 MED ORDER — PRENATAL MULTIVITAMIN CH
1.0000 | ORAL_TABLET | Freq: Every day | ORAL | Status: DC
  Administered 2015-11-13 – 2015-11-14 (×2): 1 via ORAL
  Filled 2015-11-12 (×3): qty 1

## 2015-11-12 MED ORDER — IBUPROFEN 600 MG PO TABS
600.0000 mg | ORAL_TABLET | Freq: Four times a day (QID) | ORAL | Status: DC
  Administered 2015-11-12 – 2015-11-14 (×8): 600 mg via ORAL
  Filled 2015-11-12 (×8): qty 1

## 2015-11-12 MED ORDER — DIPHENHYDRAMINE HCL 25 MG PO CAPS: 25.0000 mg | ORAL_CAPSULE | Freq: Four times a day (QID) | ORAL | Status: DC | PRN

## 2015-11-12 MED ORDER — CEFAZOLIN SODIUM-DEXTROSE 2-4 GM/100ML-% IV SOLN
2.0000 g | INTRAVENOUS | Status: AC
  Administered 2015-11-12: 2 g via INTRAVENOUS

## 2015-11-12 MED ORDER — SCOPOLAMINE 1 MG/3DAYS TD PT72
MEDICATED_PATCH | TRANSDERMAL | Status: AC
  Filled 2015-11-12: qty 1

## 2015-11-12 MED ORDER — KETOROLAC TROMETHAMINE 30 MG/ML IJ SOLN
30.0000 mg | Freq: Four times a day (QID) | INTRAMUSCULAR | Status: AC | PRN
  Administered 2015-11-12: 30 mg via INTRAMUSCULAR

## 2015-11-12 MED ORDER — MENTHOL 3 MG MT LOZG: 1.0000 | LOZENGE | OROMUCOSAL | Status: DC | PRN

## 2015-11-12 MED ORDER — PHENYLEPHRINE 8 MG IN D5W 100 ML (0.08MG/ML) PREMIX OPTIME
INJECTION | INTRAVENOUS | Status: AC
  Filled 2015-11-12: qty 100

## 2015-11-12 MED ORDER — SCOPOLAMINE 1 MG/3DAYS TD PT72: 1.0000 | MEDICATED_PATCH | Freq: Once | TRANSDERMAL | Status: DC

## 2015-11-12 MED ORDER — COCONUT OIL OIL: 1.0000 "application " | TOPICAL_OIL | Status: DC | PRN

## 2015-11-12 MED ORDER — NALOXONE HCL 2 MG/2ML IJ SOSY
1.0000 ug/kg/h | PREFILLED_SYRINGE | INTRAVENOUS | Status: DC | PRN
  Filled 2015-11-12: qty 2

## 2015-11-12 MED ORDER — SIMETHICONE 80 MG PO CHEW
80.0000 mg | CHEWABLE_TABLET | Freq: Three times a day (TID) | ORAL | Status: DC
  Administered 2015-11-12 – 2015-11-14 (×6): 80 mg via ORAL
  Filled 2015-11-12 (×6): qty 1

## 2015-11-12 MED ORDER — BUPIVACAINE IN DEXTROSE 0.75-8.25 % IT SOLN
INTRATHECAL | Status: DC | PRN
  Administered 2015-11-12: 1.4 mL via INTRATHECAL

## 2015-11-12 MED ORDER — NALOXONE HCL 0.4 MG/ML IJ SOLN: 0.4000 mg | INTRAMUSCULAR | Status: DC | PRN

## 2015-11-12 MED ORDER — OXYCODONE-ACETAMINOPHEN 5-325 MG PO TABS
1.0000 | ORAL_TABLET | ORAL | Status: DC | PRN
  Administered 2015-11-13 – 2015-11-14 (×3): 1 via ORAL
  Filled 2015-11-12 (×5): qty 1

## 2015-11-12 MED ORDER — FENTANYL CITRATE (PF) 100 MCG/2ML IJ SOLN
25.0000 ug | INTRAMUSCULAR | Status: DC | PRN
  Administered 2015-11-12: 25 ug via INTRAVENOUS

## 2015-11-12 MED ORDER — MORPHINE SULFATE-NACL 0.5-0.9 MG/ML-% IV SOSY
PREFILLED_SYRINGE | INTRAVENOUS | Status: AC
  Filled 2015-11-12: qty 1

## 2015-11-12 MED ORDER — ACETAMINOPHEN 325 MG PO TABS: 650.0000 mg | ORAL_TABLET | ORAL | Status: DC | PRN

## 2015-11-12 MED ORDER — ZOLPIDEM TARTRATE 5 MG PO TABS: 5.0000 mg | ORAL_TABLET | Freq: Every evening | ORAL | Status: DC | PRN

## 2015-11-12 MED ORDER — MEASLES, MUMPS & RUBELLA VAC ~~LOC~~ INJ: 0.5000 mL | INJECTION | Freq: Once | SUBCUTANEOUS | Status: DC

## 2015-11-12 MED ORDER — SODIUM CHLORIDE 0.9% FLUSH: 3.0000 mL | INTRAVENOUS | Status: DC | PRN

## 2015-11-12 MED ORDER — NALBUPHINE HCL 10 MG/ML IJ SOLN
5.0000 mg | INTRAMUSCULAR | Status: DC | PRN
  Administered 2015-11-13: 5 mg via SUBCUTANEOUS
  Filled 2015-11-12: qty 1

## 2015-11-12 MED ORDER — SENNOSIDES-DOCUSATE SODIUM 8.6-50 MG PO TABS
2.0000 | ORAL_TABLET | ORAL | Status: DC
  Administered 2015-11-13 – 2015-11-14 (×2): 2 via ORAL
  Filled 2015-11-12 (×2): qty 2

## 2015-11-12 MED ORDER — DIPHENHYDRAMINE HCL 25 MG PO CAPS
25.0000 mg | ORAL_CAPSULE | ORAL | Status: DC | PRN
Start: 2015-11-12 — End: 2015-11-14
  Administered 2015-11-12 – 2015-11-13 (×3): 25 mg via ORAL
  Filled 2015-11-12 (×3): qty 1

## 2015-11-12 MED ORDER — SODIUM CHLORIDE 0.9 % IR SOLN
Status: DC | PRN
  Administered 2015-11-12: 1

## 2015-11-12 MED ORDER — NALBUPHINE HCL 10 MG/ML IJ SOLN
5.0000 mg | Freq: Once | INTRAMUSCULAR | Status: AC | PRN
  Administered 2015-11-12: 5 mg via INTRAVENOUS

## 2015-11-12 MED ORDER — TETANUS-DIPHTH-ACELL PERTUSSIS 5-2.5-18.5 LF-MCG/0.5 IM SUSP: 0.5000 mL | Freq: Once | INTRAMUSCULAR | Status: DC

## 2015-11-12 MED ORDER — OXYTOCIN 10 UNIT/ML IJ SOLN
INTRAMUSCULAR | Status: AC
  Filled 2015-11-12: qty 4

## 2015-11-12 MED ORDER — KETOROLAC TROMETHAMINE 30 MG/ML IJ SOLN: 30.0000 mg | Freq: Four times a day (QID) | INTRAMUSCULAR | Status: AC | PRN

## 2015-11-12 SURGICAL SUPPLY — 35 items
BARRIER ADHS 3X4 INTERCEED (GAUZE/BANDAGES/DRESSINGS) ×3 IMPLANT
BENZOIN TINCTURE PRP APPL 2/3 (GAUZE/BANDAGES/DRESSINGS) ×3 IMPLANT
CHLORAPREP W/TINT 26ML (MISCELLANEOUS) ×3 IMPLANT
CLAMP CORD UMBIL (MISCELLANEOUS) IMPLANT
CLOSURE WOUND 1/2 X4 (GAUZE/BANDAGES/DRESSINGS) ×1
CLOTH BEACON ORANGE TIMEOUT ST (SAFETY) ×3 IMPLANT
DRSG OPSITE POSTOP 4X10 (GAUZE/BANDAGES/DRESSINGS) ×3 IMPLANT
ELECT REM PT RETURN 9FT ADLT (ELECTROSURGICAL) ×3
ELECTRODE REM PT RTRN 9FT ADLT (ELECTROSURGICAL) ×1 IMPLANT
EXTRACTOR VACUUM BELL STYLE (SUCTIONS) ×3 IMPLANT
GAUZE SPONGE 4X4 12PLY STRL LF (GAUZE/BANDAGES/DRESSINGS) ×6 IMPLANT
GLOVE BIO SURGEON STRL SZ7 (GLOVE) ×3 IMPLANT
GLOVE BIOGEL PI IND STRL 7.0 (GLOVE) ×3 IMPLANT
GLOVE BIOGEL PI INDICATOR 7.0 (GLOVE) ×6
GOWN STRL REUS W/TWL LRG LVL3 (GOWN DISPOSABLE) ×9 IMPLANT
KIT ABG SYR 3ML LUER SLIP (SYRINGE) IMPLANT
LIQUID BAND (GAUZE/BANDAGES/DRESSINGS) IMPLANT
NEEDLE HYPO 25X5/8 SAFETYGLIDE (NEEDLE) IMPLANT
NS IRRIG 1000ML POUR BTL (IV SOLUTION) ×3 IMPLANT
PACK C SECTION WH (CUSTOM PROCEDURE TRAY) ×3 IMPLANT
PAD ABD 7.5X8 STRL (GAUZE/BANDAGES/DRESSINGS) ×3 IMPLANT
PAD OB MATERNITY 4.3X12.25 (PERSONAL CARE ITEMS) ×3 IMPLANT
PENCIL SMOKE EVAC W/HOLSTER (ELECTROSURGICAL) ×3 IMPLANT
RTRCTR C-SECT PINK 25CM LRG (MISCELLANEOUS) ×3 IMPLANT
STRIP CLOSURE SKIN 1/2X4 (GAUZE/BANDAGES/DRESSINGS) ×2 IMPLANT
SUT CHROMIC 0 CTX 36 (SUTURE) IMPLANT
SUT MON AB 4-0 PS1 27 (SUTURE) ×3 IMPLANT
SUT PLAIN 0 NONE (SUTURE) IMPLANT
SUT PLAIN 2 0 XLH (SUTURE) ×3 IMPLANT
SUT VIC AB 0 CTX 36 (SUTURE) ×10
SUT VIC AB 0 CTX36XBRD ANBCTRL (SUTURE) ×5 IMPLANT
SUT VIC AB 2-0 CT1 27 (SUTURE) ×2
SUT VIC AB 2-0 CT1 TAPERPNT 27 (SUTURE) ×1 IMPLANT
TOWEL OR 17X24 6PK STRL BLUE (TOWEL DISPOSABLE) ×3 IMPLANT
TRAY FOLEY CATH SILVER 14FR (SET/KITS/TRAYS/PACK) IMPLANT

## 2015-11-12 NOTE — Addendum Note (Signed)
Addendum  created 11/12/15 1407 by Elgie CongoNataliya H Cheyene Hamric, CRNA   Sign clinical note

## 2015-11-12 NOTE — Brief Op Note (Signed)
11/12/2015  8:46 AM  PATIENT:  Rachel Lynch  25 y.o. female  PRE-OPERATIVE DIAGNOSIS:  IUP @ 39 0/7 weeks, Previous C-Section; desires sterilization  POST-OPERATIVE DIAGNOSIS:  Previous C-Section; desires sterilization  PROCEDURE:  Procedure(s) with comments: CESAREAN SECTION WITH BILATERAL TUBAL LIGATION (Bilateral) - EDD 11/19/15 Bilateral Salpingectomy  SURGEON:  Surgeon(s) and Role:    * Geryl RankinsEvelyn Carsynn Bethune, MD - Primary    * Myna HidalgoJennifer Ozan, DO - Assisting  PHYSICIAN ASSISTANT:   ASSISTANTS: Dr. Charlotta Newtonzan, Technician   ANESTHESIA:   spinal  EBL:  Total I/O In: 2400 [I.V.:2400] Out: 800 [Urine:150; Blood:650]  BLOOD ADMINISTERED:none  DRAINS: Urinary Catheter (Foley)   LOCAL MEDICATIONS USED:  NONE  SPECIMEN:  Source of Specimen:  Bilateral Fallopian tubes  DISPOSITION OF SPECIMEN:  PATHOLOGY  COUNTS:  YES  TOURNIQUET:  * No tourniquets in log *  DICTATION: .Other Dictation: Dictation Number [id number]  PLAN OF CARE: Admit to inpatient   PATIENT DISPOSITION:  PACU - hemodynamically stable.   Delay start of Pharmacological VTE agent (>24hrs) due to surgical blood loss or risk of bleeding: yes

## 2015-11-12 NOTE — Interval H&P Note (Signed)
History and Physical Interval Note:  11/12/2015 7:25 AM  Rachel Lynch  has presented today for surgery, with the diagnosis of Previous C-Section  The various methods of treatment have been discussed with the patient and family. After consideration of risks, benefits and other options for treatment, the patient has consented to  Procedure(s) with comments: CESAREAN SECTION WITH BILATERAL TUBAL LIGATION (Bilateral) - EDD 11/19/15 as a surgical intervention .  The patient's history has been reviewed, patient examined, no change in status, stable for surgery.  I have reviewed the patient's chart and labs.  Questions were answered to the patient's satisfaction.     Dion BodyVARNADO, Chantay Whitelock

## 2015-11-12 NOTE — Transfer of Care (Signed)
Immediate Anesthesia Transfer of Care Note  Patient: Rachel Lynch  Procedure(s) Performed: Procedure(s) with comments: CESAREAN SECTION WITH BILATERAL TUBAL LIGATION (Bilateral) - EDD 11/19/15  Patient Location: PACU  Anesthesia Type:Spinal  Level of Consciousness: awake, alert  and oriented  Airway & Oxygen Therapy: Patient Spontanous Breathing  Post-op Assessment: Report given to RN and Post -op Vital signs reviewed and stable  Post vital signs: Reviewed and stable  Last Vitals:  Vitals:   11/12/15 0636  BP: (!) 131/104  Pulse: (!) 108  Resp: 18  Temp: 36.7 C    Last Pain:  Vitals:   11/12/15 0636  TempSrc: Oral      Patients Stated Pain Goal: 4 (11/12/15 0636)  Complications: No apparent anesthesia complications

## 2015-11-12 NOTE — Op Note (Addendum)
NAMRaylene Lynch:  Hickok, Rachel Lynch              ACCOUNT NO.:  [id number]  MEDICAL RECORD NO.:  [id number]  LOCATION:  SDC                           FACILITY:  [facility]  PHYSICIAN:  Pieter PartridgeEvelyn B Hara Milholland, MD   DATE OF BIRTH:  1990-09-28  DATE OF PROCEDURE: 11/12/2015 DATE OF DISCHARGE:  11/14/2015                              OPERATIVE REPORT   PREOPERATIVE DIAGNOSES: 1. Intrauterine pregnancy at 3139 and 0/7 weeks. 2. History of previous C section. 3. Desires sterilization.  POSTOPERATIVE DIAGNOSES: 1. Intrauterine pregnancy at 9039 and 0/7 weeks. 2. History of previous C section. 3. Desires sterilization.  PROCEDURE:  Low-transverse cesarean section with two-layer closure and bilateral salpingectomy.  SURGEON:  Pieter PartridgeEvelyn B Shaneese Tait, MD  ASSISTANT:  Dr. Myna HidalgoJennifer Ozan and technician.  ANESTHESIA:  Spinal.  EBL:  650 urine out, 150 clear.  BLOOD ADMINISTERED:  None.  DRAINS:  Foley.  LOCAL:  Use none.  SPECIMENS:  Bilateral fallopian tubes.  DISPOSITION:  Specimen sent to Pathology.  Patient's disposition to PACU hemodynamically stable.  COMPLICATIONS:  None.  FINDINGS:  Normal uterus, fallopian tubes, and ovaries bilaterally. Female infant in the vertex position. Apgars 9 and 9.  Nuchal cord x1. Clear fluid.  PROCEDURE IN DETAIL:  [name] was identified in the holding area. She was then taken to the operating room, where she underwent spinal anesthesia without complication.  She was then prepped and draped in a normal sterile fashion.  A time-out was performed.  Abdomen was marked for Pfannenstiel skin incision.  When adequate anesthesia was confirmed, a low-transverse Pfannenstiel skin incision was made with a scalpel, carried down to the underlying layer of the fascia with the Bovie. Fascia was incised in the midline, which was somewhat dense and thick that incision was extended laterally.  The rectus muscles were dissected sharply off the fascia and separated at the midline.   Peritoneum was identified, tented up, and entered sharply with the Metzenbaum scissors. Peritoneum was stretched.  Abdomen was palpated for any adhesions. Alexis retractor was placed.   The patient had a lot of large vessels on the right-hand side.  I closed easing towards the midline.  We did open the serosa with the Metzenbaum scissors and extended the incision to develop a bladder flap, but I did make my incision a little bit more withholding the uterus back.  We were able to make the incision closer to the left and avoid both large vessels.  Bandage scissors were used after the transverse incision was made with a scalpel.  Amnion was ruptured.  Clear fluid was noted. Baby's head was floating a little bit, so it was positioned correctly. Vacuum was applied to bring the head out.  Nose and mouth were suctioned.  Nuchal cord x1 manually reduced. Shoulders delivered easily.  Baby awaited, came out spontaneous cry. Nose and mouth were suctioned again.  Delayed cord clamping for up to 1 minute.  While awaiting the angles of the uterus were grasped with Raney clamps for hemostasis.  A 1-minute cord was clamped x2 and cut.  Baby handed off to the waiting staff.  Placenta was then removed manually. Uterus was cleared of all clots and debris with a moist  laparotomy sponge.  Hysterotomy incision was closed with a 0 Vicryl in a continuous locked fashion.  U-stitch was performed for hemostasis.  The uterus was then exteriorized and the salpingectomy was performed. Patient had a long fallopian tubes bilaterally.  The mesosalpinx was identified.  A clear window was noted and the Kelly clamps were used to grasp the mesosalpinx and go through the window.  The tubes were then cut off with the Metzenbaum scissors and then tied with a 0 plain gut. We would do that up into the uterus.  The tube was then transected the same, was done on the opposite side.  The sutures were left long.  The uterus was  then returned to the abdomen very gently.  Copious irrigation was performed.  Hemostasis was achieved at the hysterotomy incision.  We then looked back at the tubal sites and no obvious bleeding was noted.  The peritoneum was then reapproximated with 2-0 Vicryl.  The fascia was then reapproximated with 0 Vicryl in a continuous running fashion.  The patient had minimal subcutaneous tissue so skin was then closed with 4-0 Monocryl on a Keith needle.  Steri-Strips and benzoin with a pressure dressing were to be applied. SCDs on operating throughout the case.  The patient received Ancef 2 g IV prior to the procedure.  All instrument, sponge, and needle counts were correct x3.  The patient was taken to the recovery room in stable condition.  Baby did well in the operating room with father throughout the remainder of the case.     Pieter Partridge, MD     EBV/MEDQ  D:  11/12/2015  T:  11/12/2015  Job:  [id number]

## 2015-11-12 NOTE — Lactation Note (Signed)
This note was copied from a baby's chart. Lactation Consultation Note Initial visit at 9 hours of age.  Mom reports a good feeding after delivery and now to sleepy to feed.  Baby is STS with mom.  Mom has large everted nipples with colostrum easily expressed.  LC assisted with latch attempt baby pulls to tip of nipple and is to sleepy to maintain feeding when latched deeply.  LC encouraged mom to wait for wide open mouth to latch baby and work on depth to protect her nipples from pain.  Baby remains STS with mom.    [facility] given and discussed.  Encouraged to feed with early cues on demand.  Early newborn behavior discussed.  Mom to call for assist as needed.    Patient Name: Rachel Vito BergerShanel Lynch ZOXWR'UToday's Date: 11/12/2015 Reason for consult: Initial assessment   Maternal Data Has patient been taught Hand Expression?: Yes Does the patient have breastfeeding experience prior to this delivery?: Yes  Feeding Feeding Type: Breast Fed  LATCH Score/Interventions Latch: Repeated attempts needed to sustain latch, nipple held in mouth throughout feeding, stimulation needed to elicit sucking reflex. Intervention(s): Adjust position;Assist with latch;Breast massage;Breast compression  Audible Swallowing: None Intervention(s): Skin to skin;Hand expression  Type of Nipple: Everted at rest and after stimulation  Comfort (Breast/Nipple): Soft / non-tender     Hold (Positioning): Assistance needed to correctly position infant at breast and maintain latch. Intervention(s): Breastfeeding basics reviewed;Support Pillows;Position options;Skin to skin  LATCH Score: 6  Lactation Tools Discussed/Used     Consult Status Consult Status: Follow-up Date: 11/13/15 Follow-up type: In-patient    Rachel Lynch, Arvella MerlesJana Lynch 11/12/2015, 5:00 PM

## 2015-11-12 NOTE — Anesthesia Procedure Notes (Signed)
Spinal  Patient location during procedure: OR Staffing Anesthesiologist: Mal AmabileFOSTER, Clarece Drzewiecki Performed: anesthesiologist  Preanesthetic Checklist Completed: patient identified, site marked, surgical consent, pre-op evaluation, timeout performed, IV checked, risks and benefits discussed and monitors and equipment checked Spinal Block Patient position: sitting Prep: site prepped and draped and DuraPrep Patient monitoring: heart rate, cardiac monitor, continuous pulse ox and blood pressure Approach: midline Location: L3-4 Injection technique: single-shot Needle Needle type: Sprotte  Needle gauge: 24 G Needle length: 9 cm Needle insertion depth: 5 cm Assessment Sensory level: T4 Additional Notes Patient tolerated procedure well. Adequate sensory level.

## 2015-11-12 NOTE — Anesthesia Postprocedure Evaluation (Signed)
Anesthesia Post Note  Patient: Rachel Lynch  Procedure(s) Performed: Procedure(s) (LRB): CESAREAN SECTION (Bilateral) BILATERAL SALPINGECTOMY (Bilateral)  Patient location during evaluation: PACU Anesthesia Type: Spinal Level of consciousness: awake and alert and oriented Pain management: pain level controlled Vital Signs Assessment: post-procedure vital signs reviewed and stable Respiratory status: spontaneous breathing, nonlabored ventilation and respiratory function stable Cardiovascular status: blood pressure returned to baseline and stable Postop Assessment: no signs of nausea or vomiting, spinal receding, no headache, no backache and patient able to bend at knees Anesthetic complications: no     Last Vitals:  Vitals:   11/12/15 0856 11/12/15 0900  BP: 116/78 113/74  Pulse:  79  Resp:  19  Temp: 36.4 C     Last Pain:  Vitals:   11/12/15 0945  TempSrc:   PainSc: 5    Pain Goal: Patients Stated Pain Goal: 4 (11/12/15 0636)               [name] A.

## 2015-11-12 NOTE — Anesthesia Preprocedure Evaluation (Addendum)
Anesthesia Evaluation  Patient identified by MRN, date of birth, ID band Patient awake    Reviewed: Allergy & Precautions, NPO status , Patient's Chart, lab work & pertinent test results  Airway Mallampati: II       Dental no notable dental hx. (+) Teeth Intact   Pulmonary neg pulmonary ROS,    Pulmonary exam normal breath sounds clear to auscultation       Cardiovascular negative cardio ROS Normal cardiovascular exam Rhythm:Regular Rate:Normal     Neuro/Psych  Headaches, negative psych ROS   GI/Hepatic negative GI ROS, Neg liver ROS,   Endo/Other  Obesity Hx/o Goiter thyroid w/u negative  Renal/GU negative Renal ROS  negative genitourinary   Musculoskeletal   Abdominal (+) + obese,   Peds  Hematology  (+) anemia ,   Anesthesia Other Findings   Reproductive/Obstetrics (+) Pregnancy Hx/o previous  C/Section Desires sterilization                             Anesthesia Physical Anesthesia Plan  ASA: II  Anesthesia Plan: Spinal   Post-op Pain Management:    Induction:   Airway Management Planned: Natural Airway  Additional Equipment:   Intra-op Plan:   Post-operative Plan:   Informed Consent: I have reviewed the patients History and Physical, chart, labs and discussed the procedure including the risks, benefits and alternatives for the proposed anesthesia with the patient or authorized representative who has indicated his/her understanding and acceptance.   Dental advisory given  Plan Discussed with: CRNA and Surgeon  Anesthesia Plan Comments:         Anesthesia Quick Evaluation

## 2015-11-12 NOTE — Op Note (Deleted)
  The note originally documented on this encounter has been moved the the encounter in which it belongs.  

## 2015-11-12 NOTE — H&P (Signed)
Rachel Lynch is a 25 y.o. femaleG6 P3023 @ 38 1/7 weeks presenting for routine ob visit.  Desires repeat cesarean section /BTL.   OB History    Gravida Para Term Preterm AB Living   6 3 3   2 3    SAB TAB Ectopic Multiple Live Births   1     0 3     Past Medical History:  Diagnosis Date  . Goiter    small, but negative thyroid studies  . Headache   . Hx of varicella   . Vaginal Pap smear, abnormal   . Varicose veins    Past Surgical History:  Procedure Laterality Date  . CESAREAN SECTION N/A 07/16/2014   Procedure: CESAREAN SECTION;  Surgeon: Rachel RankinsEvelyn Anne Sebring, MD;  Location: [facility];  Service: Obstetrics;  Laterality: N/A;  . LEEP  11/2014  . NO PAST SURGERIES     Family History: family history includes Asthma in her son; Clotting disorder in her paternal grandmother; Diabetes in her maternal grandmother and maternal uncle; Hypertension in her maternal uncle and paternal grandmother; Varicose Veins in her mother. Social History:  reports that she has never smoked. She has never used smokeless tobacco. She reports that she does not drink alcohol or use drugs.     Maternal Diabetes: No Genetic Screening: Normal Maternal Ultrasounds/Referrals: Normal Fetal Ultrasounds or other Referrals:  None Maternal Substance Abuse:  No Significant Maternal Medications:  None Significant Maternal Lab Results:  Lab values include: Group B Strep positive Other Comments:  None  Review of Systems  Gastrointestinal: Negative for abdominal pain.   Maternal Medical History:  Fetal activity: Perceived fetal activity is normal.    Prenatal complications: no prenatal complications     Blood pressure (!) 131/104, pulse (!) 108, temperature 98.1 F (36.7 C), temperature source Oral, resp. rate 18, SpO2 99 %, unknown if currently breastfeeding. Maternal Exam:  Uterine Assessment: Contraction frequency is rare.   Abdomen: Estimated fetal weight is 6.5 lbs.   Fetal presentation:  vertex     Fetal Exam Fetal Monitor Review: Mode: hand-held doppler probe.   Baseline rate: 140s.      Physical Exam  Constitutional: She is oriented to person, place, and time. She appears well-developed and well-nourished.  HENT:  Head: Normocephalic and atraumatic.  Neck: Normal range of motion.  Respiratory: Effort normal. No respiratory distress.  Musculoskeletal: She exhibits edema. She exhibits no tenderness.  Neurological: She is alert and oriented to person, place, and time.  Skin: Skin is warm and dry.  Psychiatric: She has a normal mood and affect.    Prenatal labs: ABO, Rh: --/--/B POS (08/04 1206) Antibody: NEG (08/04 1206) Rubella: Immune (12/22 0000) RPR: Non Reactive (08/04 1207)  HBsAg: Negative (12/22 0000)  HIV: Non-reactive (12/22 0000)  GBS:     Assessment/Plan: IIUP @ 38 1/7 weeks Procceed with Repeat LTCS/BTL due to unfavorable cervix. Counseled on R/B/A, of both procedures.   Rachel Lynch, Rachel Lynch

## 2015-11-12 NOTE — Anesthesia Postprocedure Evaluation (Signed)
Anesthesia Post Note  Patient: Rachel Lynch  Procedure(s) Performed: Procedure(s) (LRB): CESAREAN SECTION (Bilateral) BILATERAL SALPINGECTOMY (Bilateral)  Patient location during evaluation: Mother Baby Anesthesia Type: Spinal Level of consciousness: awake and alert Pain management: pain level controlled Vital Signs Assessment: post-procedure vital signs reviewed and stable Respiratory status: spontaneous breathing and nonlabored ventilation Cardiovascular status: stable Postop Assessment: no headache, patient able to bend at knees, no backache, no signs of nausea or vomiting, spinal receding and adequate PO intake Anesthetic complications: no     Last Vitals:  Vitals:   11/12/15 1135 11/12/15 1242  BP: 132/85 125/82  Pulse: 67 68  Resp: 18 18  Temp: 36.4 C 36.7 C    Last Pain:  Vitals:   11/12/15 1242  TempSrc: Oral  PainSc: 5    Pain Goal: Patients Stated Pain Goal: 4 (11/12/15 0636)               Laban EmperorMalinova,Argusta Mcgann Hristova

## 2015-11-13 LAB — CBC
HEMATOCRIT: 31.3 % — AB (ref 36.0–46.0)
HEMOGLOBIN: 10.7 g/dL — AB (ref 12.0–15.0)
MCH: 30.6 pg (ref 26.0–34.0)
MCHC: 34.2 g/dL (ref 30.0–36.0)
MCV: 89.4 fL (ref 78.0–100.0)
Platelets: 256 10*3/uL (ref 150–400)
RBC: 3.5 MIL/uL — AB (ref 3.87–5.11)
RDW: 14.6 % (ref 11.5–15.5)
WBC: 12.2 10*3/uL — ABNORMAL HIGH (ref 4.0–10.5)

## 2015-11-13 LAB — BIRTH TISSUE RECOVERY COLLECTION (PLACENTA DONATION)

## 2015-11-13 NOTE — Progress Notes (Signed)
Subjective: Postop Day 1: Cesarean Delivery No complaints.  Pain controlled.  Lochia normal.  Breast feeding yes.  Desires circumcision.   Objective: Temp:  [97.5 F (36.4 C)-98.6 F (37 C)] 98.1 F (36.7 C) (08/08 0145) Pulse Rate:  [60-108] 66 (08/08 0145) Resp:  [18-22] 18 (08/08 0145) BP: (101-132)/(58-104) 101/58 (08/08 0145) SpO2:  [93 %-99 %] 94 % (08/07 1725) Weight:  [75.8 kg (167 lb)] 75.8 kg (167 lb) (08/07 1900)  Physical Exam: Gen: NAD Lochia: Not visualized Uterine Fundus: firm, appropriately tender Incision: Dressing clean, dry and intact. DVT Evaluation: SCDs on.   No results for input(s): HGB, HCT in the last 72 hours.  Assessment/Plan: Status post C-section-doing well postoperatively. Routine post op care. Lactation support. Circumcision this afternoon.  Pt consented    Rachel Lynch 11/13/2015, 5:24 AM

## 2015-11-13 NOTE — Lactation Note (Signed)
This note was copied from a baby's chart. Lactation Consultation Note  Patient Name: Rachel Vito BergerShanel Vonstein ZOXWR'UToday's Date: 11/13/2015 Reason for consult: Follow-up assessment Baby at 32 hr of life. Experienced mom reports bf is going well. She denies breast or nipple pain, voiced no concerns. Discussed baby behavior, feeding frequency, voids, wt loss, breast changes, and nipple care. She can manually express and has spoon in room. She is aware of lactation services and support group. She will call as needed.    Maternal Data    Feeding Feeding Type: Breast Fed Length of feed: 15 min  LATCH Score/Interventions                      Lactation Tools Discussed/Used     Consult Status Consult Status: Follow-up Date: 11/14/15 Follow-up type: In-patient    Rulon Eisenmengerlizabeth E Malik Ruffino 11/13/2015, 4:09 PM

## 2015-11-13 NOTE — Progress Notes (Signed)
Patient encouraged to ambulate halls to pass flatus. Patient instructed of plan of care  And all questions answered.

## 2015-11-13 NOTE — Discharge Instructions (Signed)
Cesarean Delivery, Care After  Refer to this sheet in the next few weeks. These instructions provide you with information on caring for yourself after your procedure. Your health care provider may also give you specific instructions. Your treatment has been planned according to current medical practices, but problems sometimes occur. Call your health care provider if you have any problems or questions after you go home.  HOME CARE INSTRUCTIONS   Only take over-the-counter or prescription medications as directed by your health care provider.   Do not drink alcohol, especially if you are breastfeeding or taking medication to relieve pain.   Do not chew or smoke tobacco.   Continue to use good perineal care. Good perineal care includes:    Wiping your perineum from front to back.    Keeping your perineum clean.   Check your surgical cut (incision) daily for increased redness, drainage, swelling, or separation of skin.   Clean your incision gently with soap and water every day, and then pat it dry. If your health care provider says it is okay, leave the incision uncovered. Use a bandage (dressing) if the incision is draining fluid or appears irritated. If the adhesive strips across the incision do not fall off within 7 days, carefully peel them off.   Hug a pillow when coughing or sneezing until your incision is healed. This helps to relieve pain.   Do not use tampons or douche until your health care provider says it is okay.   Shower, wash your hair, and take tub baths as directed by your health care provider.   Wear a well-fitting bra that provides breast support.   Limit wearing support panties or control-top hose.   Drink enough fluids to keep your urine clear or pale yellow.   Eat high-fiber foods such as whole grain cereals and breads, brown rice, beans, and fresh fruits and vegetables every day. These foods may help prevent or relieve constipation.   Resume activities such as climbing stairs,  driving, lifting, exercising, or traveling as directed by your health care provider.   Talk to your health care provider about resuming sexual activities. This is dependent upon your risk of infection, your rate of healing, and your comfort and desire to resume sexual activity.   Try to have someone help you with your household activities and your newborn for at least a few days after you leave the hospital.   Rest as much as possible. Try to rest or take a nap when your newborn is sleeping.   Increase your activities gradually.   Keep all of your scheduled postpartum appointments. It is very important to keep your scheduled follow-up appointments. At these appointments, your health care provider will be checking to make sure that you are healing physically and emotionally.  SEEK MEDICAL CARE IF:    You are passing large clots from your vagina. Save any clots to show your health care provider.   You have a foul smelling discharge from your vagina.   You have trouble urinating.   You are urinating frequently.   You have pain when you urinate.   You have a change in your bowel movements.   You have increasing redness, pain, or swelling near your incision.   You have pus draining from your incision.   Your incision is separating.   You have painful, hard, or reddened breasts.   You have a severe headache.   You have blurred vision or see spots.   You feel sad   or depressed.   You have thoughts of hurting yourself or your newborn.   You have questions about your care, the care of your newborn, or medications.   You are dizzy or light-headed.   You have a rash.   You have pain, redness, or swelling at the site of the removed intravenous access (IV) tube.   You have nausea or vomiting.   You stopped breastfeeding and have not had a menstrual period within 12 weeks of stopping.   You are not breastfeeding and have not had a menstrual period within 12 weeks of delivery.   You have a fever.  SEEK  IMMEDIATE MEDICAL CARE IF:   You have persistent pain.   You have chest pain.   You have shortness of breath.   You faint.   You have leg pain.   You have stomach pain.   Your vaginal bleeding saturates 2 or more sanitary pads in 1 hour.  MAKE SURE YOU:    Understand these instructions.   Will watch your condition.   Will get help right away if you are not doing well or get worse.     This information is not intended to replace advice given to you by your health care provider. Make sure you discuss any questions you have with your health care provider.     Document Released: 12/14/2001 Document Revised: 04/14/2014 Document Reviewed: 11/19/2011  Elsevier Interactive Patient Education 2016 [employer]

## 2015-11-14 MED ORDER — IBUPROFEN 600 MG PO TABS: 600.0000 mg | ORAL_TABLET | Freq: Four times a day (QID) | ORAL | 1 refills | Status: AC

## 2015-11-14 MED ORDER — SIMETHICONE 80 MG PO CHEW: 80.0000 mg | CHEWABLE_TABLET | ORAL | 0 refills | Status: AC | PRN

## 2015-11-14 MED ORDER — DOCUSATE SODIUM 100 MG PO CAPS: 100.0000 mg | ORAL_CAPSULE | Freq: Two times a day (BID) | ORAL | 0 refills | Status: AC

## 2015-11-14 MED ORDER — BISACODYL 10 MG RE SUPP
10.0000 mg | Freq: Once | RECTAL | Status: AC
  Administered 2015-11-14: 10 mg via RECTAL
  Filled 2015-11-14: qty 1

## 2015-11-14 MED ORDER — OXYCODONE-ACETAMINOPHEN 5-325 MG PO TABS: 1.0000 | ORAL_TABLET | ORAL | 0 refills | Status: AC | PRN

## 2015-11-14 NOTE — Progress Notes (Signed)
Patient has been given discharge instructions and paper scripts, one being narcotic.  All questions answered and issues addressed.  Patient has appointment with Peds in the morning and a follow up appointment to check on her blood pressure per MD next week, as discussed during exit interview with MD at bedside.   All personal belongings removed from room.

## 2015-11-14 NOTE — Lactation Note (Signed)
This note was copied from a baby's chart. Lactation Consultation Note: Experienced BF mom has baby latched to breast when I went into room. Reports he has been nursing well. Reports breasts are feeling fuller this morning. Has Medela pump for home. Reviewed engorgement prevention and treatment. No questions at present. Reviewed our phone number to call with questions or if wants OP appointment. To call prn  Patient Name: Rachel Lynch: 11/14/2015 Reason for consult: Follow-up assessment   Maternal Data Formula Feeding for Exclusion: No Has patient been taught Hand Expression?: Yes Does the patient have breastfeeding experience prior to this delivery?: Yes  Feeding Feeding Type: Breast Fed  LATCH Score/Interventions Latch: Grasps breast easily, tongue down, lips flanged, rhythmical sucking.  Audible Swallowing: A few with stimulation  Type of Nipple: Everted at rest and after stimulation  Comfort (Breast/Nipple): Soft / non-tender     Hold (Positioning): No assistance needed to correctly position infant at breast.  LATCH Score: 9  Lactation [facility]   Consult Status Consult Status: Complete    Pamelia HoitWeeks, Jamiel Goncalves D 11/14/2015, 10:05 AM

## 2015-11-14 NOTE — Discharge Summary (Signed)
OB Discharge Summary     Patient Name: Rachel Lynch DOB: 02/06/1991 MRN: [id number]  Date of admission: 11/12/2015 Delivering MD: Geryl Rankins   Date of discharge: 11/14/2015  Admitting diagnosis: Z98.891 Previous C-Section Intrauterine pregnancy: [redacted]w[redacted]d     Secondary diagnosis:  Active Problems:   Status post repeat low transverse cesarean section  Additional problems: None     Discharge diagnosis: Term Pregnancy Delivered                                                                                                Post partum procedures:Bilateral salpingectomy at time of cesarean section  Augmentation: N/a  Complications: None  Hospital course:  Sceduled C/S   25 y.o. yo [id number] at [redacted]w[redacted]d was admitted to the hospital 11/12/2015 for scheduled cesarean section with the following indication:Elective Repeat.  Membrane Rupture Time/Date: 7:56 AM ,11/12/2015   Patient delivered a Viable infant.11/12/2015  Details of operation can be found in separate operative note.  Pateint had an uncomplicated postpartum course.  She is ambulating, tolerating a regular diet, passing flatus, and urinating well. Patient is discharged home in stable condition on  11/25/15          Physical exam Vitals:   11/13/15 0145 11/13/15 0545 11/13/15 1731 11/14/15 0610  BP: (!) 101/58 113/75 111/70 (!) 139/98  Pulse: 66 77 78 78  Resp: Temp: 98.1 F (36.7 C) 98.1 F (36.7 C) 98.2 F (36.8 C) 98.6 F (37 C)  TempSrc: Oral Oral Oral   SpO2:      Weight:      Height:       General: alert, cooperative and no distress Lochia: appropriate Uterine Fundus: firm Incision: Dressing is clean, dry, and intact DVT Evaluation: No evidence of DVT seen on physical exam. Calf/Ankle edema is present Labs: Lab Results  Component Value Date   WBC 12.2 (H) 11/13/2015   HGB 10.7 (L) 11/13/2015   HCT 31.3 (L) 11/13/2015   MCV 89.4 11/13/2015   PLT 256 11/13/2015   No flowsheet data  found.  Discharge instruction: per After Visit Summary and "Baby and Me Booklet".  After visit meds:    Medication List    STOP taking these medications   DICLEGIS 10-10 MG Tbec Generic drug:  Doxylamine-Pyridoxine     TAKE these medications   docusate sodium 100 MG capsule Commonly known as:  COLACE Take 1 capsule (100 mg total) by mouth 2 (two) times daily.   hydrocortisone cream 1 % Apply 1 application topically 2 (two) times daily.   ibuprofen 600 MG tablet Commonly known as:  ADVIL,MOTRIN Take 1 tablet (600 mg total) by mouth every 6 (six) hours.   oxyCODONE-acetaminophen 5-325 MG tablet Commonly known as:  PERCOCET/ROXICET Take 1 tablet by mouth every 4 (four) hours as needed (pain scale 4-7).   prenatal multivitamin Tabs tablet Take 1 tablet by mouth daily at 12 noon.   simethicone 80 MG chewable tablet Commonly known as:  MYLICON Chew 1 tablet (80 mg total) by mouth as needed for flatulence.  Diet: routine diet  Activity: Advance as tolerated. Pelvic rest for 6 weeks.   Outpatient follow up:2 weeks Follow up Appt:No future appointments. Follow up Visit:No Follow-up on file.  Postpartum contraception: Tubal Ligation  Newborn Data: Live born female  Birth Weight: 6 lb 10.9 oz (3030 g) APGAR: 9, 9  Baby Feeding: Breast Disposition:home with mother. Circumcision done prior to discharge.   11/14/2015 Geryl RankinsVARNADO, Yoland Scherr, MD

## 2016-06-10 ENCOUNTER — Other Ambulatory Visit: Payer: Self-pay | Admitting: Obstetrics and Gynecology

## 2016-06-10 ENCOUNTER — Other Ambulatory Visit (HOSPITAL_COMMUNITY)
Admission: RE | Admit: 2016-06-10 | Discharge: 2016-06-10 | Disposition: A | Discharge status: Home / Self Care | Payer: Commercial Managed Care - PPO | Source: Ambulatory Visit | Attending: Obstetrics and Gynecology | Admitting: Obstetrics and Gynecology

## 2016-06-10 DIAGNOSIS — Z01419 Encounter for gynecological examination (general) (routine) without abnormal findings: Secondary | ICD-10-CM | POA: Insufficient documentation

## 2016-06-10 DIAGNOSIS — Z1151 Encounter for screening for human papillomavirus (HPV): Secondary | ICD-10-CM | POA: Diagnosis not present

## 2016-06-12 LAB — CYTOLOGY - PAP
Diagnosis: NEGATIVE
HPV: NOT DETECTED

## 2017-12-08 IMAGING — US US EXTREM LOW VENOUS*R*
1 series · 13 of 24 positions shown · non-contrast
Comparison: None.

CLINICAL DATA: Pain in the right mid calf for 2 days. Patient is 27
weeks pregnant.



[Series 1: us extrem low venous*right* · 0.07mm/px · 13 of 31 slices shown]
[im 1/31]
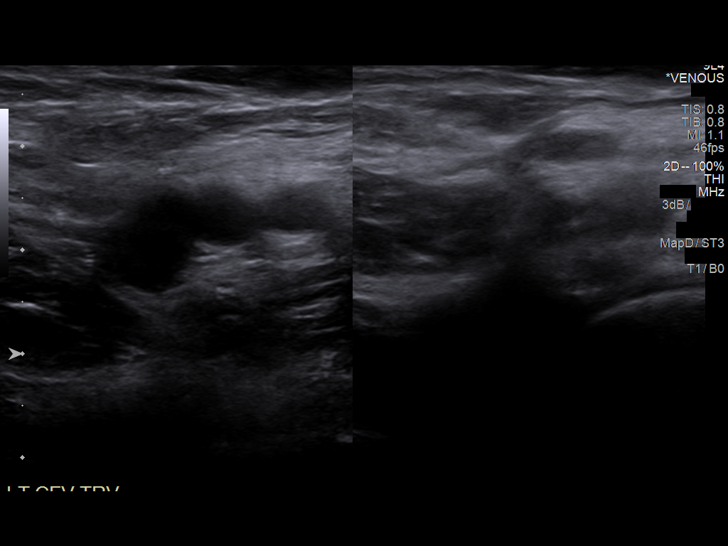
[im 3/31]
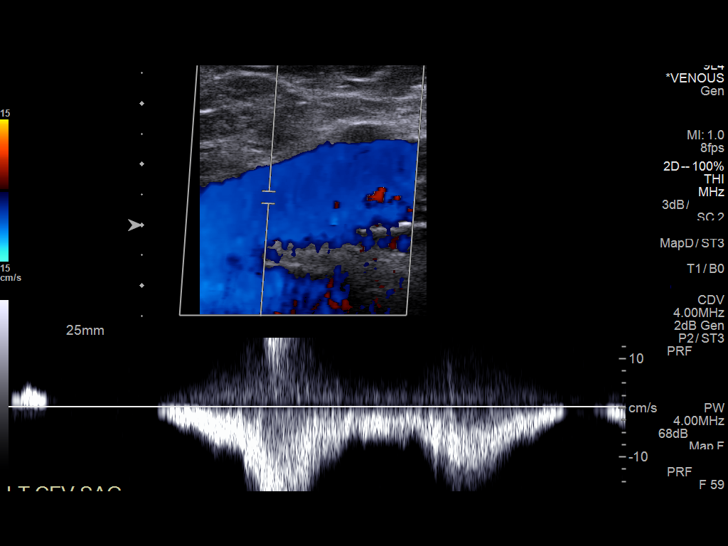
[im 6/31]
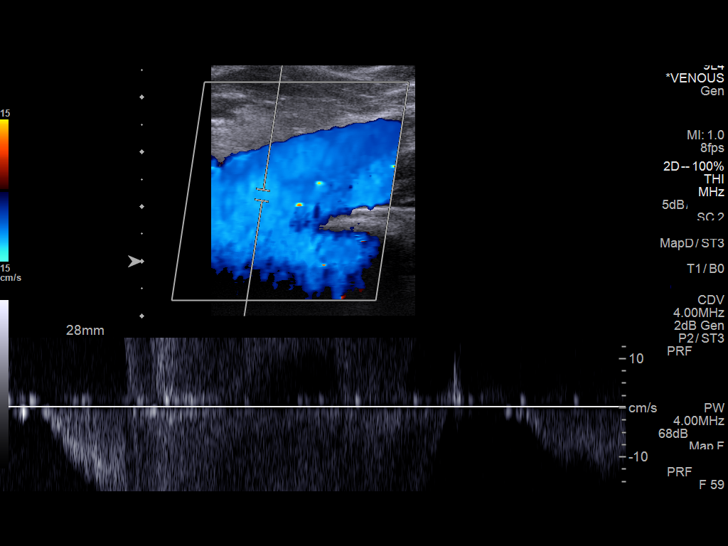
[im 8/31]
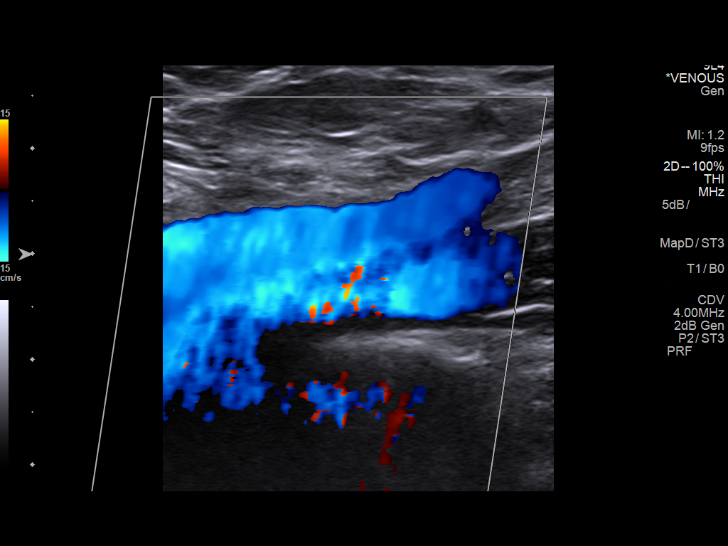
[im 11/31]
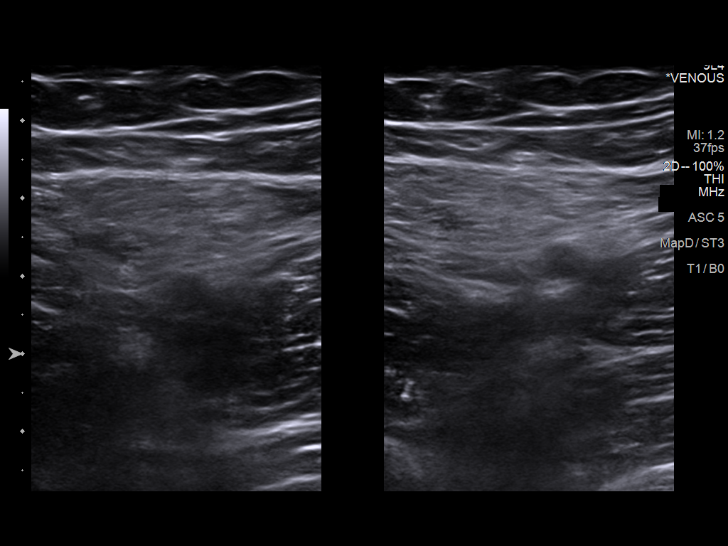
[im 14/31]
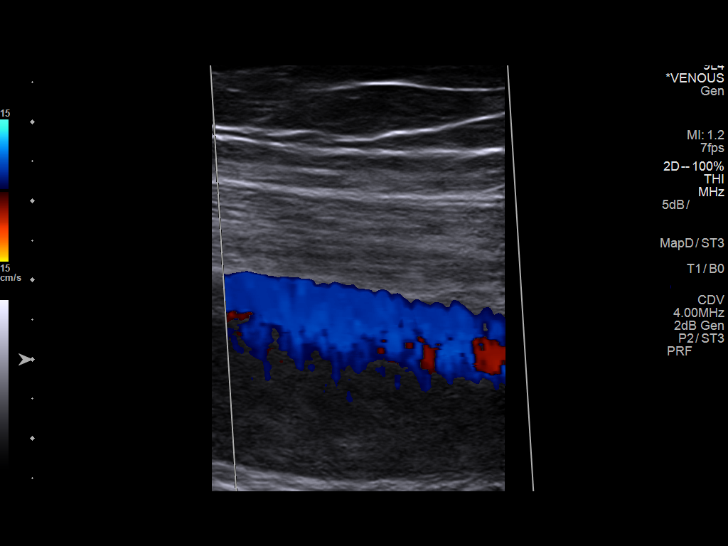
[im 16/31]
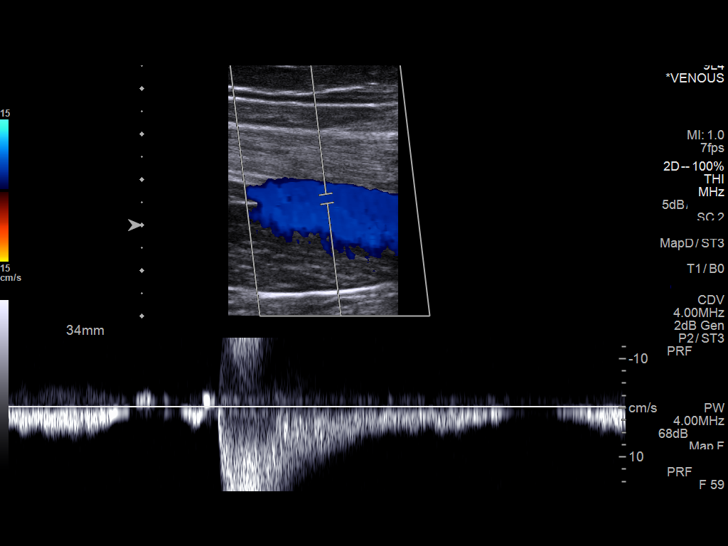
[im 17/31]
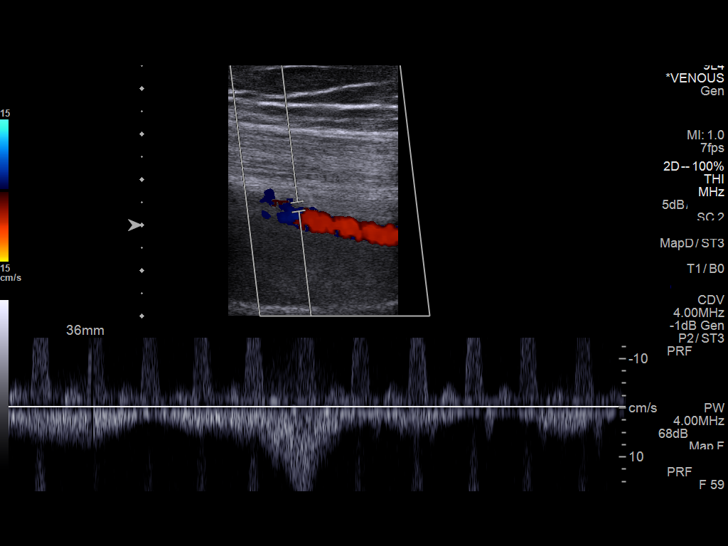
[im 20/31]
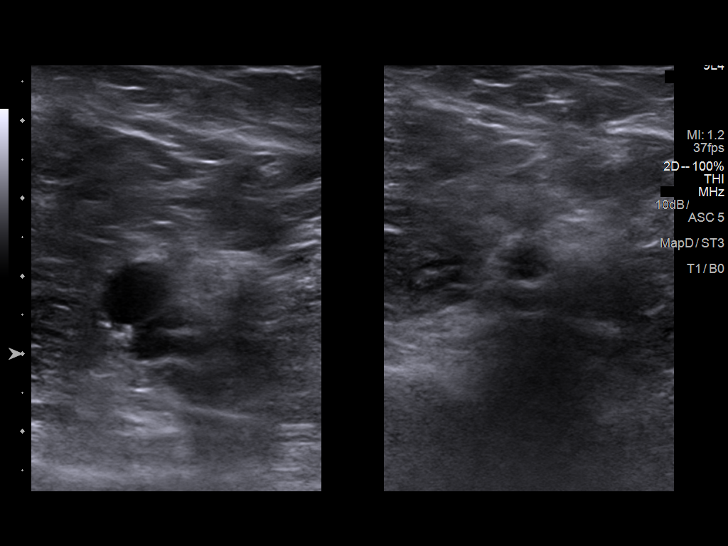
[im 23/31]
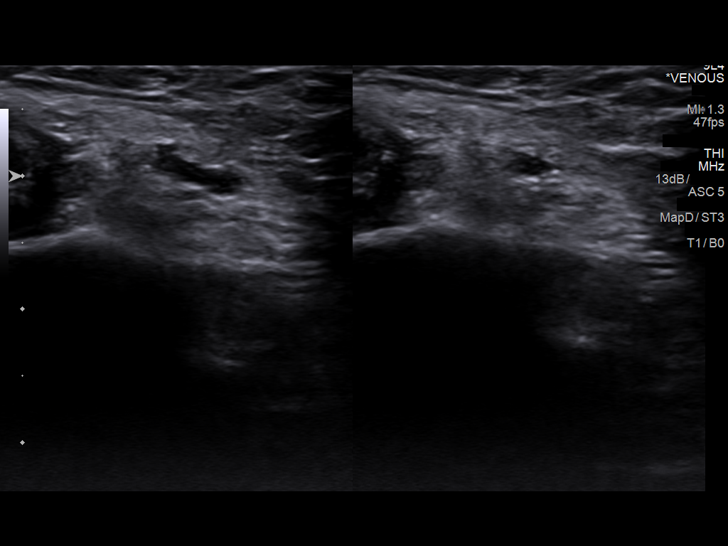
[im 25/31]
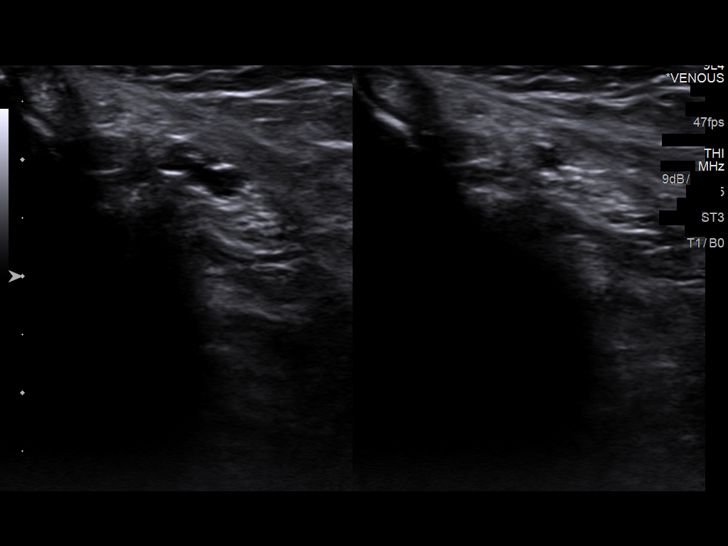
[im 28/31]
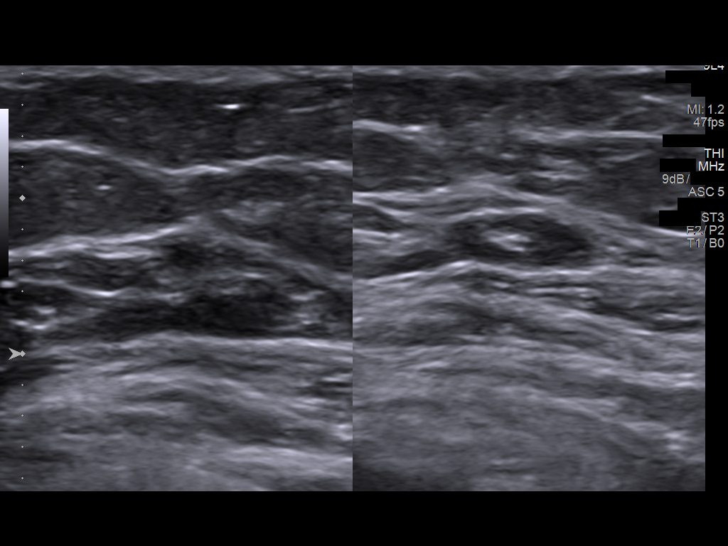
[im 31/31]
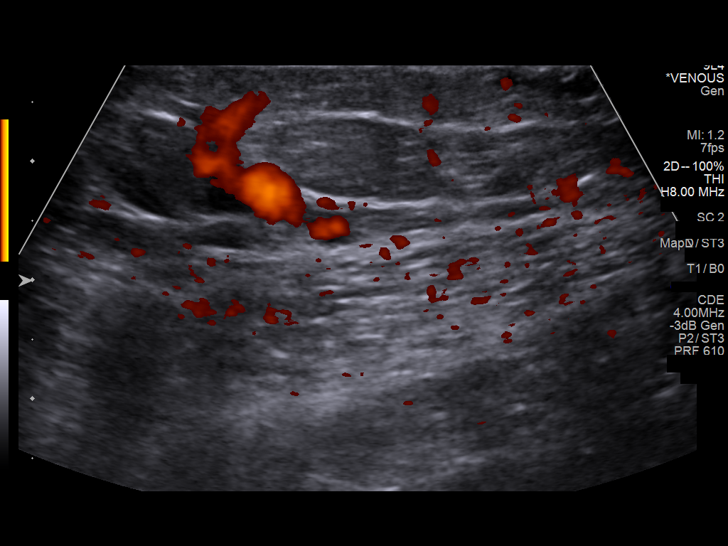

[13 of 24 positions shown; findings below may reference images not displayed]

FINDINGS: Contralateral Common Femoral Vein: Respiratory phasicity is normal
and symmetric with the symptomatic side. No evidence of thrombus.
Normal compressibility.

Common Femoral Vein: No evidence of thrombus. Normal
compressibility, respiratory phasicity and response to augmentation.

Saphenofemoral Junction: No evidence of thrombus. Normal
compressibility and flow on color Doppler imaging.

Profunda Femoral Vein: No evidence of thrombus. Normal
compressibility and flow on color Doppler imaging.

Femoral Vein: No evidence of thrombus. Normal compressibility,
respiratory phasicity and response to augmentation.

Popliteal Vein: No evidence of thrombus. Normal compressibility,
respiratory phasicity and response to augmentation.

Calf Veins: No evidence of thrombus. Normal compressibility and flow
on color Doppler imaging.

Superficial Great Saphenous Vein: No evidence of thrombus. Normal
compressibility and flow on color Doppler imaging.

Venous Reflux:  None.

Other Findings:  None.
IMPRESSION: No evidence of deep venous thrombosis.

## 2018-03-23 IMAGING — US US MFM OB TRANSVAGINAL
1 series · 15 of 19 positions shown · non-contrast
Comparison: none

[Series 1: us mfm ob transvaginal · 15 of 19 slices shown]
[im 1/19]
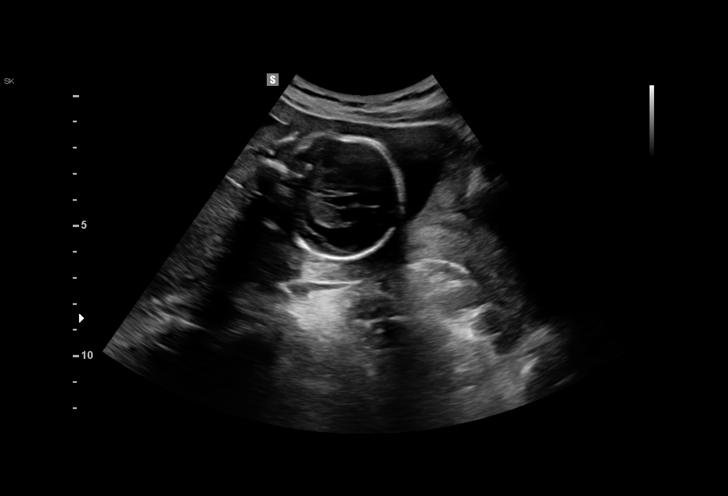
[im 2/19]
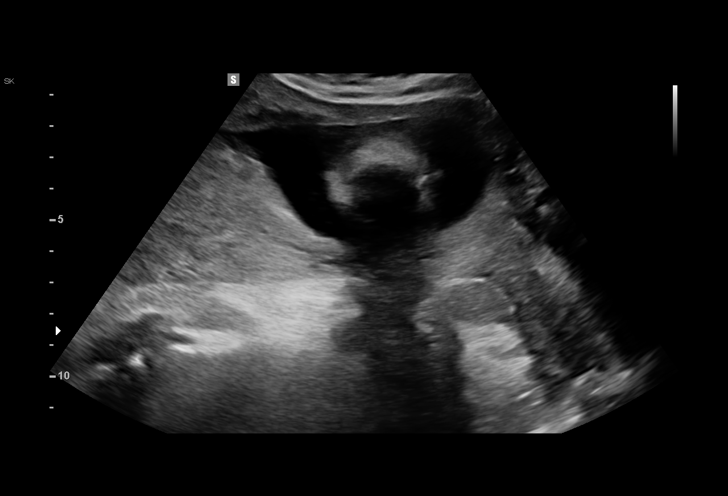
[im 4/19]
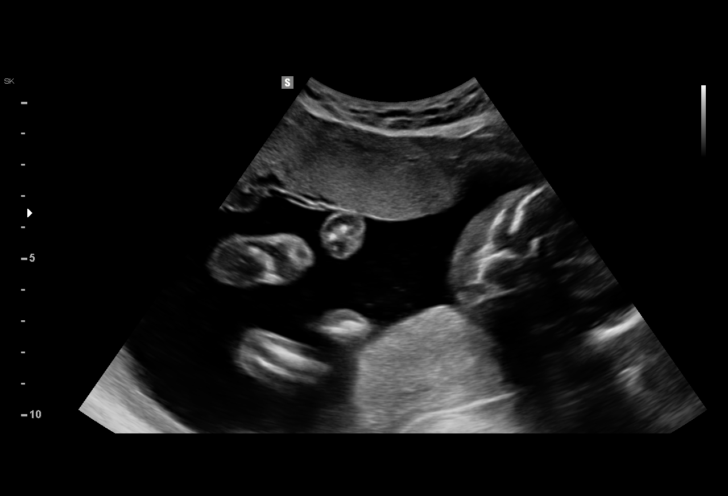
[im 5/19]
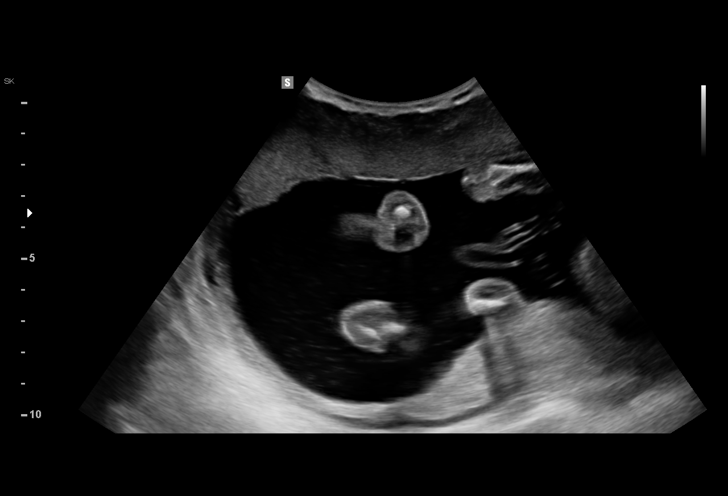
[im 6/19]
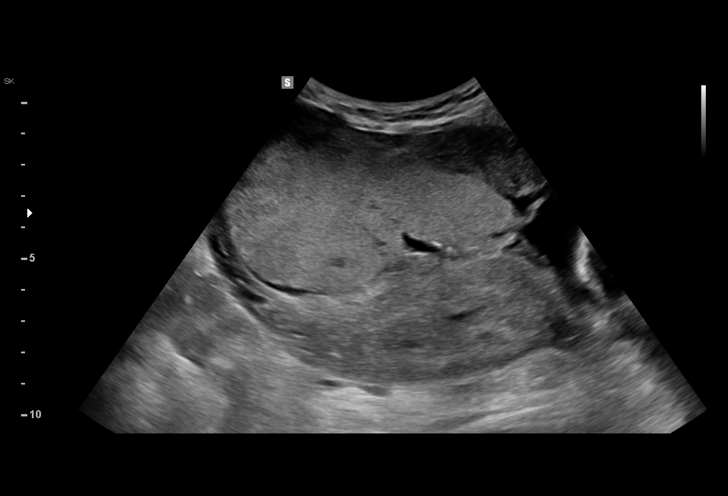
[im 7/19]
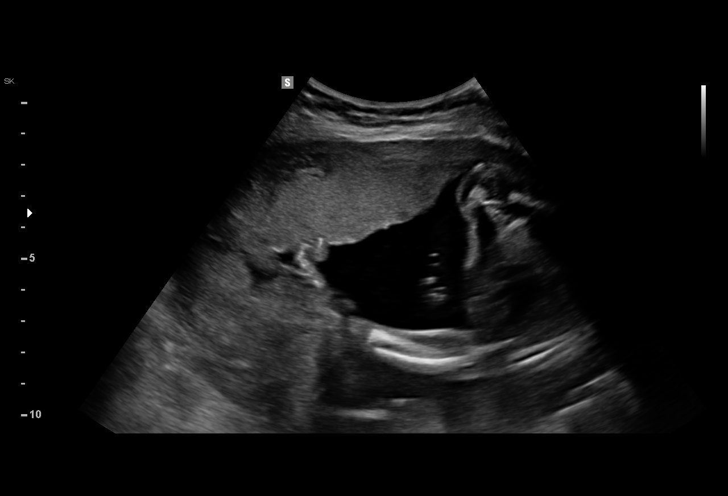
[im 9/19]
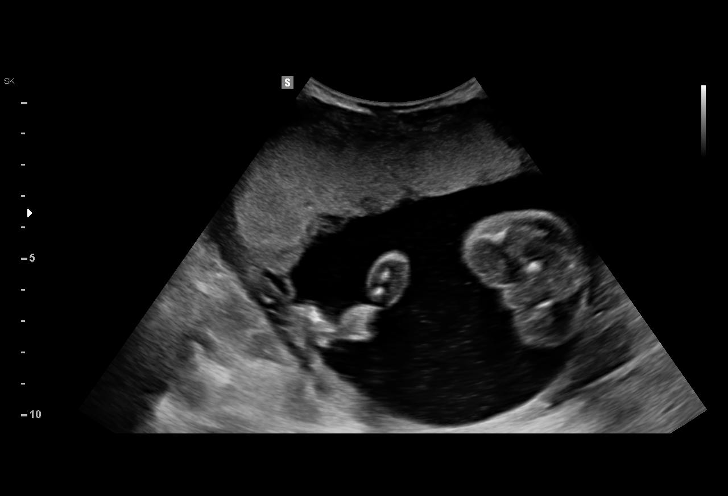
[im 10/19]
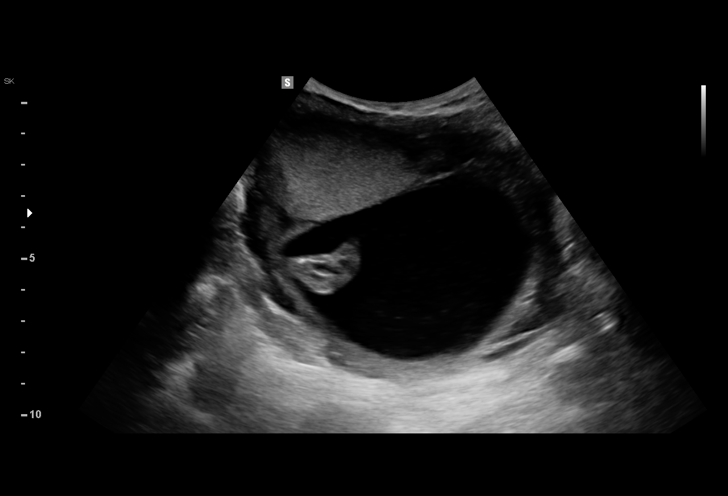
[im 11/19]
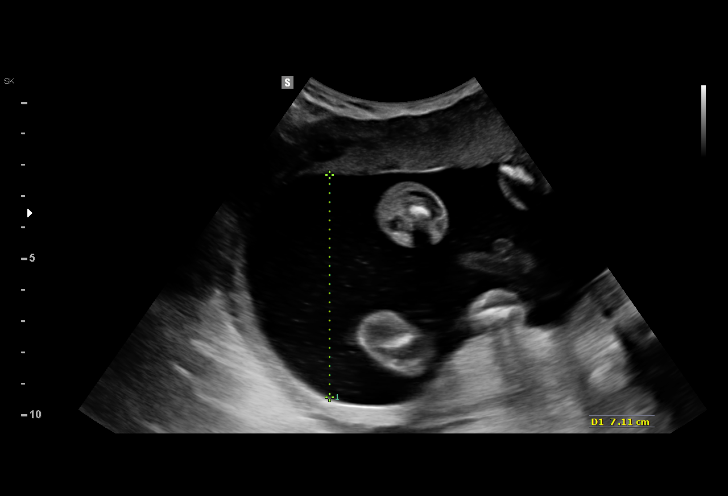
[im 13/19]
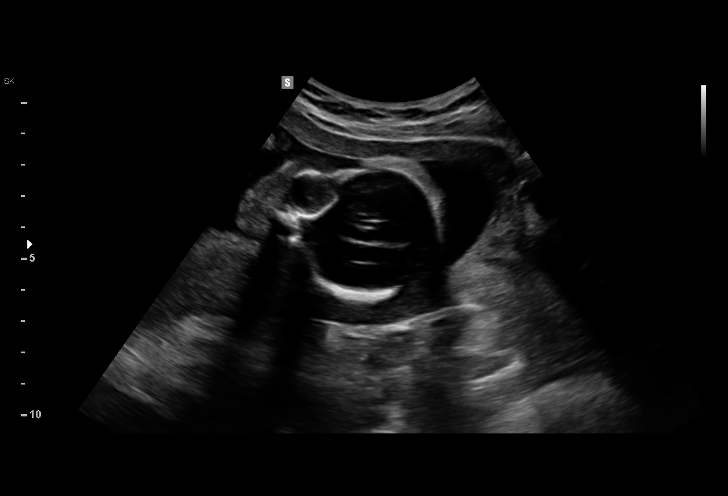
[im 14/19]
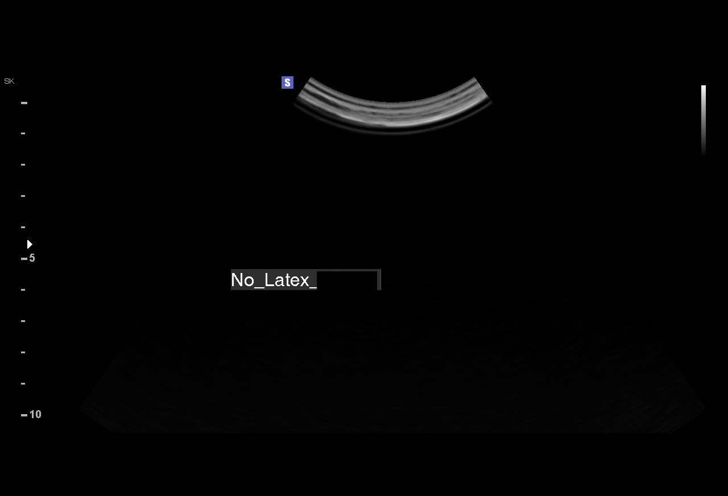
[im 15/19]
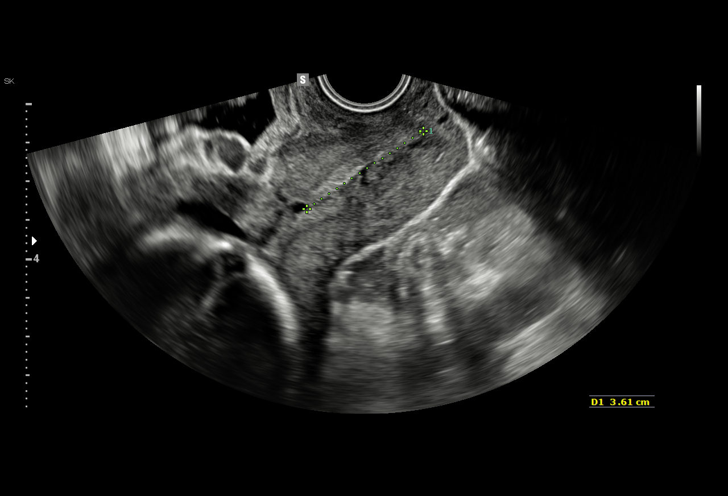
[im 16/19]
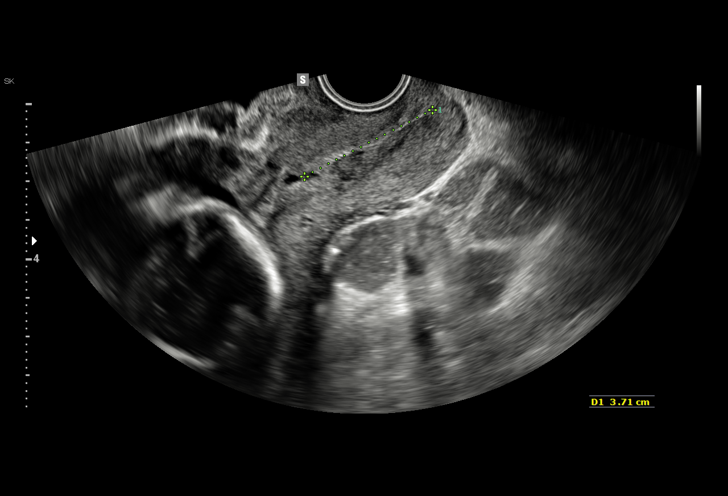
[im 18/19]
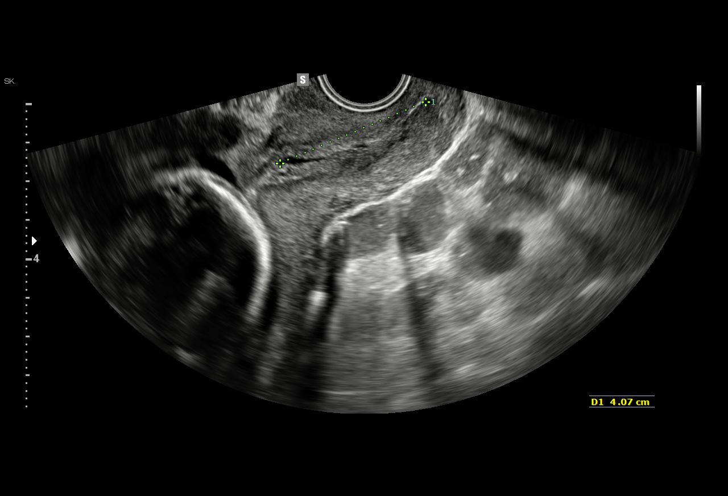
[im 19/19]
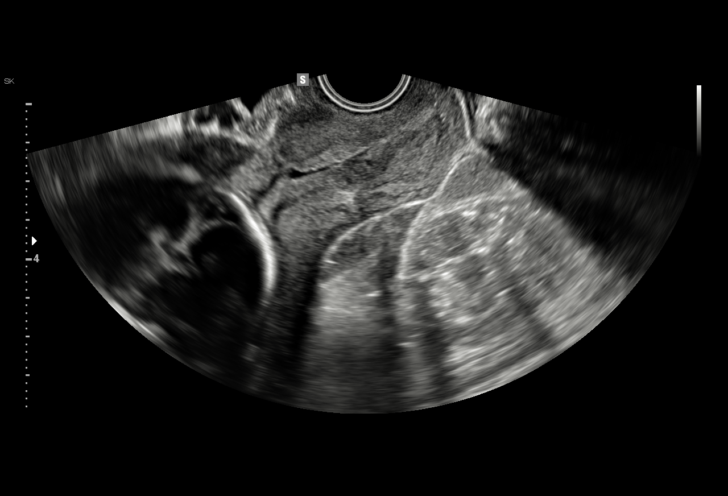

[15 of 19 positions shown; findings below may reference images not displayed]

1  GIORGI JUMPER            [id number]      [id number]     [id number]
Indications

Abdominal pain in pregnancy
21 weeks gestation of pregnancy
OB History

Gravidity:    6         Term:   3         SAB:   1
TOP:          1
Fetal Evaluation

Num Of Fetuses:     1
Fetal Heart         144
Rate(bpm):
Cardiac Activity:   Observed
Presentation:       Cephalic
Placenta:           Anterior, above cervical os

Amniotic Fluid
AFI FV:      Subjectively within normal limits
Larg Pckt:     7.1  cm
Gestational Age

LMP:           21w 3d        Date:  02/12/15                 EDD:   11/19/15
Best:          21w 3d     Det. By:  LMP  (02/12/15)          EDD:   11/19/15
Cervix Uterus Adnexa

Cervix
Length:            3.7  cm.
Normal appearance by transvaginal scan
Impression

Single IUP at 21w 3d
Limited ultrasound performed due to abdominal pain
Anterior placenta without previa - no subchorionic fluid
collections noted
TVUS - cervical length 3.7 cm without funneling
Recommendations

Follow up ultrasounds as clinically indicated
